# Patient Record
Sex: Male | Born: 1950 | Race: White | Hispanic: No | Marital: Married | Smoking: Never smoker
Health system: Southern US, Community
[De-identification: ages and names within clinical notes are randomized; demographics above are authoritative.]

## PROBLEM LIST (undated history)

## (undated) ENCOUNTER — Ambulatory Visit: Payer: PRIVATE HEALTH INSURANCE

## (undated) DIAGNOSIS — K219 Gastro-esophageal reflux disease without esophagitis: Secondary | ICD-10-CM

## (undated) DIAGNOSIS — M1990 Unspecified osteoarthritis, unspecified site: Secondary | ICD-10-CM

## (undated) DIAGNOSIS — R234 Changes in skin texture: Secondary | ICD-10-CM

## (undated) DIAGNOSIS — E785 Hyperlipidemia, unspecified: Secondary | ICD-10-CM

## (undated) DIAGNOSIS — I4891 Unspecified atrial fibrillation: Secondary | ICD-10-CM

## (undated) DIAGNOSIS — I428 Other cardiomyopathies: Secondary | ICD-10-CM

## (undated) DIAGNOSIS — K429 Umbilical hernia without obstruction or gangrene: Secondary | ICD-10-CM

## (undated) DIAGNOSIS — C801 Malignant (primary) neoplasm, unspecified: Secondary | ICD-10-CM

## (undated) HISTORY — DX: Malignant (primary) neoplasm, unspecified: C80.1

## (undated) HISTORY — PX: HERNIA REPAIR: SHX51

---

## 1997-06-02 ENCOUNTER — Ambulatory Visit (HOSPITAL_COMMUNITY): Admission: RE | Admit: 1997-06-02 | Discharge: 1997-06-02 | Payer: Self-pay | Admitting: Podiatry

## 1997-12-11 ENCOUNTER — Emergency Department (HOSPITAL_COMMUNITY): Admission: EM | Admit: 1997-12-11 | Discharge: 1997-12-11 | Payer: Self-pay | Admitting: Emergency Medicine

## 2001-09-18 ENCOUNTER — Ambulatory Visit (HOSPITAL_COMMUNITY): Admission: RE | Admit: 2001-09-18 | Discharge: 2001-09-18 | Payer: Self-pay | Admitting: Pediatric Nephrology

## 2006-10-18 ENCOUNTER — Encounter: Admission: RE | Admit: 2006-10-18 | Discharge: 2006-10-18 | Payer: Self-pay | Admitting: Family Medicine

## 2007-06-27 ENCOUNTER — Encounter: Admission: RE | Admit: 2007-06-27 | Discharge: 2007-06-27 | Payer: Self-pay | Admitting: Family Medicine

## 2008-01-12 ENCOUNTER — Encounter: Admission: RE | Admit: 2008-01-12 | Discharge: 2008-01-12 | Payer: Self-pay | Admitting: Family Medicine

## 2008-06-24 ENCOUNTER — Encounter: Admission: RE | Admit: 2008-06-24 | Discharge: 2008-06-24 | Payer: Self-pay | Admitting: Family Medicine

## 2010-06-02 NOTE — Op Note (Signed)
   [name] [name], [name] [name]                        ACCOUNT NO.:  [id number]   MEDICAL RECORD NO.:  [id number]                   PATIENT TYPE:  AMB   LOCATION:  ENDO                                 FACILITY:  [facility]   PHYSICIAN:  Petra Kuba, M.D.                 DATE OF BIRTH:  06/10/1950   DATE OF PROCEDURE:  09/18/2001  DATE OF DISCHARGE:                                 OPERATIVE REPORT   PROCEDURE:  Esophagogastroduodenoscopy.   ENDOSCOPIST:  Petra Kuba, M.D.   INDICATIONS:  Patient with longstanding upper tract symptoms.  Want to rule  out Barrett's or other etiologies.   INFORMED CONSENT:  Consent was signed after risks, benefits, methods and  options were thoroughly discussed in the office.  [name]   MEDICATIONS USED:  Additional medicines for this procedure - Demerol 20 mg  and Versed 2 mg.   DESCRIPTION OF PROCEDURE:  The video endoscope was inserted by direct  vision.  The proximal and mid esophagus were normal.  In the distal  esophagus was a moderate size hiatal hernia.  No signs of Barrett's  esophagus or significant esophagitis was seen.  The scope was passed into  the stomach, advanced through a normal antrum, normal pylorus and into a  normal duodenal bulb, around the C-loop to a normal second portion of the  duodenum.  The scope was drawn back to the bulb and good look there ruled  out ulcerative complications.  The scope was withdrawn back to the stomach  and retroflexed.  High in the cardia, the hiatal hernia was confirmed.  The  angularis, lesser and greater curve and fundus were all normal.  The  retroflexion with straight visualization of the stomach did reveal some mild  gastritis but without any other lesions.  Air was suctioned, and the scope  was slowly withdrawn.  Again, on slow withdrawal, a good look at the hiatal  hernia pouch and the esophagus were normal.  The scope was removed.  The  patient tolerated the procedure well.  There was no obvious  immediate  complication.   ENDOSCOPIC DIAGNOSES:  1. Small to medium size hiatal hernia.  2. Minimal gastritis.  3. Otherwise normal esophagogastroduodenoscopy.   PLAN:  1. Continue pump inhibitors.  2. Follow up p.r.n. or in two months to recheck symptoms and make sure no     further workup plans are needed.                                               Petra Kuba, M.D.    MEM/MEDQ  D:  09/18/2001  T:  09/19/2001  Job:  [id number]   cc:   Desma Maxim, M.D.

## 2010-06-02 NOTE — Op Note (Signed)
[name] [name], [name] [name]                        ACCOUNT NO.:  [id number]   MEDICAL RECORD NO.:  [id number]                   PATIENT TYPE:  AMB   LOCATION:  ENDO                                 FACILITY:  [facility]   PHYSICIAN:  Petra Kuba, M.D.                 DATE OF BIRTH:  Mar 07, 1950   DATE OF PROCEDURE:  09/18/2001  DATE OF DISCHARGE:                                 OPERATIVE REPORT   PROCEDURE:  Colonoscopy.   ENDOSCOPIST:  Petra Kuba, M.D.   INDICATIONS FOR PROCEDURE:  History of colon polyps; due for repeat  screening.   INFORMED CONSENT:  Consent was signed after risks, benefits, methods and  options were thoroughly discussed in the office.   MEDICATIONS USED:  Demerol 80 mg, Versed 8 mg.   DESCRIPTION OF PROCEDURE:  Rectal inspection was pertinent for external  hemorrhoids.  Digital exam was negative.   The video pediatric adjustable colonoscope was inserted and fairly easily  advanced around the colon to the cecum.  This did require some abdominal  pressure but no position changes.  No obvious abnormality was seen on  insertion.  The cecum was identified by the appendiceal orifice and the  ileocecal valve.  In fact, the scope was inserted a short way into the  terminal ileum which was normal.  Photodocumentation was obtained.  The  scope was slowly withdrawn.  On slow withdrawal through the colon, no  polypoid lesions, masses or other abnormalities were seen as we slowly  withdrew back to the rectum.  In the rectum, however, there was some very  minimal proctitis that look probably more prepped induced than a real  significant proctitis.  The scope was retroflexed showing some tiny internal  hemorrhoids.  The  scope was straightened and readvanced up a short ways up  the sigmoid.  Air was suctioned and scope removed.  The patient tolerated  the procedure well.  There was no obvious immediate complication.   ENDOSCOPIC DIAGNOSES:  1. Small internal and external  hemorrhoids.  2. Minimal proctitis, probably prep induced.  3. Otherwise within normal limits to the terminal ileum.    PLAN:  1. Probably recheck colon screening in five years.  Since I do not have his     dictation from his previous colonoscopy, I will get that just to confirm     it is okay to wait until then.  2. Continue workup and plans with an EGD for his longstanding upper tract     symptoms.                                               Petra Kuba, M.D.    MEM/MEDQ  D:  09/18/2001  T:  09/19/2001  Job:  [id number]  cc:   Desma Maxim, M.D.

## 2011-06-26 ENCOUNTER — Emergency Department (INDEPENDENT_AMBULATORY_CARE_PROVIDER_SITE_OTHER)
Admission: EM | Admit: 2011-06-26 | Discharge: 2011-06-26 | Disposition: A | Discharge status: Home / Self Care | Payer: BC Managed Care – PPO | Source: Home / Self Care

## 2011-06-26 ENCOUNTER — Encounter (HOSPITAL_COMMUNITY): Payer: Self-pay | Admitting: *Deleted

## 2011-06-26 DIAGNOSIS — J340 Abscess, furuncle and carbuncle of nose: Secondary | ICD-10-CM

## 2011-06-26 DIAGNOSIS — J3489 Other specified disorders of nose and nasal sinuses: Secondary | ICD-10-CM

## 2011-06-26 HISTORY — DX: Hyperlipidemia, unspecified: E78.5

## 2011-06-26 HISTORY — DX: Gastro-esophageal reflux disease without esophagitis: K21.9

## 2011-06-26 MED ORDER — MUPIROCIN CALCIUM 2 % EX CREA: TOPICAL_CREAM | Freq: Three times a day (TID) | CUTANEOUS | Status: AC

## 2011-06-26 MED ORDER — DOXYCYCLINE HYCLATE 100 MG PO TABS: 100.0000 mg | ORAL_TABLET | Freq: Two times a day (BID) | ORAL | Status: AC

## 2011-06-26 NOTE — ED Provider Notes (Signed)
[name] [name] is a 61 y.o. male who presents to Urgent Care today for and swelling for 3 days. Patient initially noted mild tenderness and discharge.  Over the last few days the swelling and pain and tenderness have worsened.  He notes some pain in the face and teeth.  He denies any pain with eye motion, however he notes a mild headache.    Also of note he had a tick bite one month ago and aside from the above complaints is asymptomatic.   PMH reviewed. Significant for hyperlipidemia History  Substance Use Topics  . Smoking status: Not on file  . Smokeless tobacco: Not on file  . Alcohol Use: Not on file   ROS as above Medications reviewed. No current facility-administered medications for this encounter.   Current Outpatient Prescriptions  Medication Sig Dispense Refill  . doxycycline (VIBRA-TABS) 100 MG tablet Take 1 tablet (100 mg total) by mouth 2 (two) times daily.  14 tablet  0  . mupirocin cream (BACTROBAN) 2 % Apply topically 3 (three) times daily.  15 g  0    Exam:  BP 134/75  Pulse 68  Temp(Src) 98.5 F (36.9 C) (Oral)  Resp 18  SpO2 96% Gen: Well NAD HEENT: EOMI,  MMM, the tip of the nose is erythematous and tender. The anterior nares have erythema and tenderness with some pus collected on the nasal hair.  No facial tenderness to palpation area no oral lesions.  Nasal turbinates are normal appearing proximally.   Skin: No rashes present  No results found for this or any previous visit (from the past 24 hour(s)). No results found.  Assessment and Plan: 61 y.o. male with nasal cellulitis.  Plan to treat with oral doxycycline and topical mupirocin.  Discussed warning signs or symptoms including for preseptal and post-septal cellulitis.  Recommended followup with primary care doctor if not improving.  Also warned about sun exposure with doxycycline.  Please see discharge instructions. Patient expresses understanding.     Rodolph Bong, MD 06/26/11 [phone]

## 2011-06-26 NOTE — ED Notes (Signed)
3 days of redness and swelling of the nose

## 2011-06-26 NOTE — Discharge Instructions (Signed)
Thank you for coming in today. Take the oral antibiotic twice a day for one week Apply antibiotic cream 3 times a day for a week. Wear plenty of sunscreen. If your pain worsens or you have fevers or chills or pain when you move her eyes go to the emergency room. Followup with your primary care doctor if you do not improve.

## 2011-06-26 NOTE — ED Provider Notes (Signed)
Medical screening examination/treatment/procedure(s) were performed by a resident physician and as supervising physician I was immediately available for consultation/collaboration.  Leslee Home, M.D.   Reuben Likes, MD 06/26/11 2119

## 2011-08-10 ENCOUNTER — Other Ambulatory Visit: Payer: Self-pay | Admitting: Family Medicine

## 2011-08-10 DIAGNOSIS — R413 Other amnesia: Secondary | ICD-10-CM

## 2011-08-17 ENCOUNTER — Ambulatory Visit
Admission: RE | Admit: 2011-08-17 | Discharge: 2011-08-17 | Disposition: A | Discharge status: Home / Self Care | Payer: BC Managed Care – PPO | Source: Ambulatory Visit | Attending: Family Medicine | Admitting: Family Medicine

## 2011-08-17 DIAGNOSIS — R413 Other amnesia: Secondary | ICD-10-CM

## 2011-08-17 MED ORDER — GADOBENATE DIMEGLUMINE 529 MG/ML IV SOLN
20.0000 mL | Freq: Once | INTRAVENOUS | Status: AC | PRN
  Administered 2011-08-17: 20 mL via INTRAVENOUS

## 2011-09-14 ENCOUNTER — Ambulatory Visit
Admission: RE | Admit: 2011-09-14 | Discharge: 2011-09-14 | Disposition: A | Discharge status: Home / Self Care | Payer: BC Managed Care – PPO | Source: Ambulatory Visit | Attending: Family Medicine | Admitting: Family Medicine

## 2011-09-14 ENCOUNTER — Other Ambulatory Visit: Payer: Self-pay | Admitting: Family Medicine

## 2011-09-14 DIAGNOSIS — R1031 Right lower quadrant pain: Secondary | ICD-10-CM

## 2011-09-14 MED ORDER — IOHEXOL 300 MG/ML  SOLN
100.0000 mL | Freq: Once | INTRAMUSCULAR | Status: AC | PRN
  Administered 2011-09-14: 100 mL via INTRAVENOUS

## 2012-07-15 ENCOUNTER — Other Ambulatory Visit: Payer: Self-pay | Admitting: Otolaryngology

## 2012-07-15 DIAGNOSIS — H905 Unspecified sensorineural hearing loss: Secondary | ICD-10-CM

## 2013-01-15 DIAGNOSIS — C801 Malignant (primary) neoplasm, unspecified: Secondary | ICD-10-CM

## 2013-01-15 HISTORY — DX: Malignant (primary) neoplasm, unspecified: C80.1

## 2013-09-09 ENCOUNTER — Other Ambulatory Visit: Payer: Self-pay | Admitting: Family Medicine

## 2013-09-09 DIAGNOSIS — R109 Unspecified abdominal pain: Secondary | ICD-10-CM

## 2013-09-16 ENCOUNTER — Other Ambulatory Visit: Payer: BC Managed Care – PPO

## 2013-09-17 ENCOUNTER — Ambulatory Visit
Admission: RE | Admit: 2013-09-17 | Discharge: 2013-09-17 | Disposition: A | Discharge status: Home / Self Care | Payer: BC Managed Care – PPO | Source: Ambulatory Visit | Attending: Family Medicine | Admitting: Family Medicine

## 2013-09-17 DIAGNOSIS — R109 Unspecified abdominal pain: Secondary | ICD-10-CM

## 2013-09-17 MED ORDER — IOHEXOL 300 MG/ML  SOLN
125.0000 mL | Freq: Once | INTRAMUSCULAR | Status: AC | PRN
  Administered 2013-09-17: 125 mL via INTRAVENOUS

## 2014-11-25 ENCOUNTER — Ambulatory Visit
Admission: RE | Admit: 2014-11-25 | Discharge: 2014-11-25 | Disposition: A | Discharge status: Home / Self Care | Payer: BLUE CROSS/BLUE SHIELD | Source: Ambulatory Visit | Attending: Family Medicine | Admitting: Family Medicine

## 2014-11-25 ENCOUNTER — Other Ambulatory Visit: Payer: Self-pay | Admitting: Family Medicine

## 2014-11-25 DIAGNOSIS — R079 Chest pain, unspecified: Secondary | ICD-10-CM

## 2015-12-22 ENCOUNTER — Other Ambulatory Visit: Payer: Self-pay | Admitting: Family Medicine

## 2015-12-22 ENCOUNTER — Ambulatory Visit
Admission: RE | Admit: 2015-12-22 | Discharge: 2015-12-22 | Disposition: A | Discharge status: Home / Self Care | Payer: BLUE CROSS/BLUE SHIELD | Source: Ambulatory Visit | Attending: Family Medicine | Admitting: Family Medicine

## 2015-12-22 DIAGNOSIS — M25562 Pain in left knee: Secondary | ICD-10-CM

## 2015-12-22 DIAGNOSIS — M25552 Pain in left hip: Secondary | ICD-10-CM

## 2016-01-16 DIAGNOSIS — Z8679 Personal history of other diseases of the circulatory system: Secondary | ICD-10-CM

## 2016-01-16 HISTORY — DX: Personal history of other diseases of the circulatory system: Z86.79

## 2016-05-30 ENCOUNTER — Other Ambulatory Visit: Payer: Self-pay | Admitting: Family Medicine

## 2016-05-30 ENCOUNTER — Ambulatory Visit
Admission: RE | Admit: 2016-05-30 | Discharge: 2016-05-30 | Disposition: A | Discharge status: Home / Self Care | Payer: BLUE CROSS/BLUE SHIELD | Source: Ambulatory Visit | Attending: Family Medicine | Admitting: Family Medicine

## 2016-05-30 DIAGNOSIS — I4891 Unspecified atrial fibrillation: Secondary | ICD-10-CM

## 2016-05-30 DIAGNOSIS — R0609 Other forms of dyspnea: Secondary | ICD-10-CM

## 2016-05-30 DIAGNOSIS — R0600 Dyspnea, unspecified: Secondary | ICD-10-CM

## 2016-05-31 ENCOUNTER — Telehealth: Payer: Self-pay | Admitting: Cardiovascular Disease

## 2016-05-31 NOTE — Telephone Encounter (Signed)
Received records from [facility] for appointment on 06/07/16 with Dr Croitoru.  Records put with Dr Croitoru's schedule for 06/07/16. lp  °

## 2016-06-07 ENCOUNTER — Ambulatory Visit (INDEPENDENT_AMBULATORY_CARE_PROVIDER_SITE_OTHER): Payer: BLUE CROSS/BLUE SHIELD | Admitting: Cardiovascular Disease

## 2016-06-07 ENCOUNTER — Encounter: Payer: Self-pay | Admitting: Cardiovascular Disease

## 2016-06-07 VITALS — BP 110/84 | HR 97 | Ht 68.0 in | Wt 254.0 lb

## 2016-06-07 DIAGNOSIS — R739 Hyperglycemia, unspecified: Secondary | ICD-10-CM

## 2016-06-07 DIAGNOSIS — I4891 Unspecified atrial fibrillation: Secondary | ICD-10-CM

## 2016-06-07 DIAGNOSIS — E668 Other obesity: Secondary | ICD-10-CM | POA: Diagnosis not present

## 2016-06-07 DIAGNOSIS — I428 Other cardiomyopathies: Secondary | ICD-10-CM

## 2016-06-07 DIAGNOSIS — E7800 Pure hypercholesterolemia, unspecified: Secondary | ICD-10-CM | POA: Diagnosis not present

## 2016-06-07 MED ORDER — RIVAROXABAN 20 MG PO TABS: 20.0000 mg | ORAL_TABLET | Freq: Every day | ORAL | 11 refills | Status: DC

## 2016-06-07 NOTE — Patient Instructions (Signed)
Medication Instructions: Dr Sallyanne Kuster has recommended making the following medication changes: 1. START Xarelto 20 mg - take 1 tablet by mouth daily  Labwork: NONE ORDERED  Testing/Procedures: 1. Echocardiogram  - Your physician has requested that you have an echocardiogram. Echocardiography is a painless test that uses sound waves to create images of your heart. It provides your doctor with information about the size and shape of your heart and how well your heart's chambers and valves are working. This procedure takes approximately one hour. There are no restrictions for this procedure.  2. Exercise Tolerance Test - Your physician has requested that you have an exercise tolerance test. For further information please visit HugeFiesta.tn. Please also follow instruction sheet, as given.  These tests have been ordered to be performed at our [facility] location - [address].  Follow-up: Dr Sallyanne Kuster recommends that you schedule a follow-up appointment after tests are completed.  If you need a refill on your cardiac medications before your next appointment, please call your pharmacy.

## 2016-06-08 ENCOUNTER — Encounter: Payer: Self-pay | Admitting: Cardiovascular Disease

## 2016-06-08 DIAGNOSIS — I428 Other cardiomyopathies: Secondary | ICD-10-CM | POA: Insufficient documentation

## 2016-06-08 DIAGNOSIS — R739 Hyperglycemia, unspecified: Secondary | ICD-10-CM | POA: Insufficient documentation

## 2016-06-08 DIAGNOSIS — E668 Other obesity: Secondary | ICD-10-CM | POA: Insufficient documentation

## 2016-06-08 DIAGNOSIS — I4891 Unspecified atrial fibrillation: Secondary | ICD-10-CM | POA: Insufficient documentation

## 2016-06-08 DIAGNOSIS — E7800 Pure hypercholesterolemia, unspecified: Secondary | ICD-10-CM | POA: Insufficient documentation

## 2016-06-08 NOTE — Progress Notes (Signed)
Cardiology consultation Note:    Date:  06/08/2016   ID:  ZALAN SHIDLER, DOB [birth year], MRN [id number]  PCP:  Mayra Neer, MD  Cardiologist:  Sanda Klein, MD    Referring MD: Mayra Neer, MD  Chief Complaint  Patient presents with  . Follow-up    New patient.  . Atrial Fibrillation  . Shortness of Breath  . Chest Pain  . Headache  . Edema   GEREMY [name] is a 66 y.o. male who is being seen today for the evaluation of newly diagnosed atrial fibrillation at the request of K. Brigitte Pulse, MD   History of Present Illness:    [name] [name] is a 66 y.o. male with a hx of Hyperlipidemia gastroesophageal reflux disease and remote melanoma resection, presenting for recently diagnosed atrial fibrillation. Reports noticing some mild irregularity in his heartbeat for many years, but when he was seen in the office on May 16 was found to be in atrial fibrillation was spontaneously controlled ventricular response. Previous physical exam and ECG most recently performed in January documented normal sinus rhythm.  He does not have a history of stroke or other focal neurological deficits. He has not had serious bleeding problems and has not had problems with falls or injuries.[name] [name] He is almost 66 years old. His most recent fasting glucose was borderline diagnostic of diabetes mellitus 126. But as recently as January his glucose was 94. He does not have a history of coronary disease are other vascular problems. He has tried numerous statins for hyperlipidemia (including simvastatin, pravastatin, rosuvastatin) and the only one he has tolerated is Livalo.  His records include an echocardiogram from 2008 which was performed for cardiomegaly. At that point his left ventricle was at upper limits of normal in size with an end-diastolic diameter 55 mm. Ejection fraction was normal estimated at 65% but the left atrium was mildly dilated at 41 mm. No serious valvular abnormalities were found. No comment was  made about diastolic function. He had a nuclear stress test also in 2008 which was a normal study.  The patient specifically denies any chest pain at rest or exertion, dyspnea at rest or with exertion, orthopnea, paroxysmal nocturnal dyspnea, syncope, rare palpitations, focal neurological deficits, intermittent claudication, unexplained weight gain, cough, hemoptysis or wheezing. No GI bleeding or other bleeding problems. No falls. He snores, but does not have daytime hypersomnolence. He only scores 5 points on the Epworth scale. Over the last 2 or 3 months he has had problems with swelling in his legs that usually resolves after he lies down overnight.   Past Medical History:  Diagnosis Date  . Cancer ([place])   . GERD (gastroesophageal reflux disease)   . Hyperlipidemia     No past surgical history on file.  Current Medications: Current Meds  Medication Sig  . aspirin 81 MG chewable tablet Chew 81 mg by mouth daily.  [name] [name] atorvastatin (LIPITOR) 10 MG tablet Take 10 mg by mouth daily.  . BUPROPION HCL PO Take 150 mg by mouth daily.  . naproxen sodium (ANAPROX) 220 MG tablet Take 440 mg by mouth every morning.  [name] [name] omeprazole (PRILOSEC) 20 MG capsule Take 20 mg by mouth daily.  . [DISCONTINUED] IBUPROFEN PO Take by mouth daily as needed.     Allergies:   Patient has no known allergies.   Social History   Social History  . Marital status: Married    Spouse name: N/A  . Number of children: N/A  . Years of  education: N/A   Social History Main Topics  . Smoking status: Never Smoker  . Smokeless tobacco: Not on file  . Alcohol use Yes     Comment: occasionally  . Drug use: No  . Sexual activity: Not on file   Other Topics Concern  . Not on file   Social History Narrative  . No narrative on file     Family History: The patient's family history includes Alzheimer's disease in his sister; Heart Problems in his brother, brother, and father; Stroke in his brother. ROS:   Please see  the history of present illness.     All other systems reviewed and are negative.  EKGs/Labs/Other Studies Reviewed:    The following studies were reviewed today: Echo and Nuclear study from 2008, ECG from Dr. Brigitte Pulse from January and May 2018  EKG:  EKG is  ordered today.  The ekg ordered today demonstrates atrial fibrillation with ventricular rate 97 bpm at rest. There is poor R-wave progression with transition in V5 and left axis deviation, not meeting criteria for left anterior fascicular block. Normal QTC 447 ms. No clear repolarization abnormalities are seen.  Recent Labs: 05/30/2016 hemoglobin 15.4, glucose 126, BUN 18, creatinine 1.1, potassium 3.8, normal liver function tests, TSH 2.6 Hemoglobin A1c 5.8%  Total cholesterol 190, triglycerides 132, HDL 46, LDL 117  Physical Exam:    VS:  BP 110/84   Pulse 97   Ht 5\' 8"  (1.727 m)   Wt 254 lb (115.2 kg)   BMI 38.62 kg/m     Wt Readings from Last 3 Encounters:  06/07/16 254 lb (115.2 kg)     GEN:  Well nourished, well developed in no acute distress HEENT: Normal NECK: No JVD; No carotid bruits LYMPHATICS: No lymphadenopathy CARDIAC: irregular, no murmurs, rubs, gallops RESPIRATORY:  Clear to auscultation without rales, wheezing or rhonchi  ABDOMEN: Soft, non-tender, non-distended MUSCULOSKELETAL:  No edema; No deformity  SKIN: Warm and dry NEUROLOGIC:  Alert and oriented x 3 PSYCHIATRIC:  Normal affect   ASSESSMENT:    1. Atrial fibrillation, unspecified type ([place])   2. Nonischemic cardiomyopathy ([place])   3. Hyperglycemia   4. Hypercholesterolemia   5. Moderate obesity    PLAN:    In order of problems listed above:  1. AFib; rate control has occur spontaneously, was suggesting may have some degree of AV conduction abnormality. We'll need to make sure that his rate is indeed controlled throughout the day. May need a 24-hour monitor. At this point I'm not sure whether he has paroxysmal atrial fibrillation (just  coincidently identified on 2 separate office visits) or whether he is in a spell of persistent atrial fibrillation. Will get an opportunity to see how persistent rhythm is when he comes back for echo and stress testing. The stress test might also give [place] an idea about how his heart rate response to activity and whether or not he needs rate control medications. Also unclear is whether or not he needs to be on long-term anticoagulation. He has several borderline risk factors: Age 43, hyperglycemia without overt diabetes mellitus, history of borderline left ventricular enlargement without heart failure, dilated left atrium by echo in 2008. Depending on how one categorizes these borderline risk factors his CHADSVasc score could be as low as 0 or as high as 3. I believe that eventually he will require anticoagulation as he gets older and I believe his bleeding risk is low. As such, I recommended that he starts anticoagulation with a  direct oral anticoagulant. He prefers a once daily agents so we'll use Xarelto. Discussed the purpose of this medication and its risks and benefits. Instructed him to report bleeding complications promptly. He's not particularly symptomatic from the arrhythmia. Nevertheless it might be worthwhile to consider cardioversion, since this is an initial presentation. First we have to establish whether or not this is truly a persistent arrhythmia or whether it has been starting and stopping by itself. If this is persistent atrial fibrillation, there is no rush to proceed to cardioversion. Would only do so after at least 3 weeks of compliance with oral anticoagulant. 2. CMP: Abnormalities detected by echo in 2008 suggest that he may have a slowly progressive cardiomyopathy. He has developed problems with leg edema. Repeat echocardiogram. 3. Hyperglycemia: Both his glucose and hemoglobin A1c are abnormal, but he does not fully meet criteria for diabetes at this point. Encouraged him to lose  weight, avoid simple carbohydrates and starches with high glycemic index, exercise regularly. 4. HLP: He has been intolerant of other statins but seems to be doing okay on current dose of Livalo. Ideally his LDL would be under 100, but he may not tolerate higher doses of statin. 5. Moderate obesity: Discussed the relationship between the burden of atrial fibrillation and weight. Weight loss would be beneficial for many points of view   Medication Adjustments/Labs and Tests Ordered: Current medicines are reviewed at length with the patient today.  Concerns regarding medicines are outlined above. Labs and tests ordered and medication changes are outlined in the patient instructions below:  Patient Instructions  Medication Instructions: Dr Sallyanne Kuster has recommended making the following medication changes: 1. START Xarelto 20 mg - take 1 tablet by mouth daily  Labwork: NONE ORDERED  Testing/Procedures: 1. Echocardiogram  - Your physician has requested that you have an echocardiogram. Echocardiography is a painless test that uses sound waves to create images of your heart. It provides your doctor with information about the size and shape of your heart and how well your heart's chambers and valves are working. This procedure takes approximately one hour. There are no restrictions for this procedure.  2. Exercise Tolerance Test - Your physician has requested that you have an exercise tolerance test. For further information please visit HugeFiesta.tn. Please also follow instruction sheet, as given.  These tests have been ordered to be performed at our [facility] location - [address].  Follow-up: Dr Sallyanne Kuster recommends that you schedule a follow-up appointment after tests are completed.  If you need a refill on your cardiac medications before your next appointment, please call your pharmacy.    Signed, Sanda Klein, MD  06/08/2016 12:34 PM    [place]

## 2016-06-19 ENCOUNTER — Telehealth: Payer: Self-pay | Admitting: *Deleted

## 2016-06-19 NOTE — Telephone Encounter (Signed)
PA #[id number] sent through cover my meds and approved.

## 2016-06-21 ENCOUNTER — Ambulatory Visit: Payer: BLUE CROSS/BLUE SHIELD | Admitting: Cardiovascular Disease

## 2016-06-26 ENCOUNTER — Ambulatory Visit (HOSPITAL_COMMUNITY): Payer: BLUE CROSS/BLUE SHIELD | Attending: Cardiology

## 2016-06-26 ENCOUNTER — Ambulatory Visit (INDEPENDENT_AMBULATORY_CARE_PROVIDER_SITE_OTHER): Payer: BLUE CROSS/BLUE SHIELD

## 2016-06-26 ENCOUNTER — Other Ambulatory Visit: Payer: Self-pay

## 2016-06-26 DIAGNOSIS — I4891 Unspecified atrial fibrillation: Secondary | ICD-10-CM

## 2016-06-26 DIAGNOSIS — I358 Other nonrheumatic aortic valve disorders: Secondary | ICD-10-CM | POA: Insufficient documentation

## 2016-06-26 DIAGNOSIS — E785 Hyperlipidemia, unspecified: Secondary | ICD-10-CM | POA: Diagnosis not present

## 2016-06-26 LAB — EXERCISE TOLERANCE TEST
CHL CUP RESTING HR STRESS: 112 {beats}/min
CHL RATE OF PERCEIVED EXERTION: 16
CSEPEW: 4.6 METS
Exercise duration (min): 3 min
Exercise duration (sec): 0 s
MPHR: 156 {beats}/min
Peak HR: 173 {beats}/min
Percent HR: 110 %

## 2016-06-26 MED ORDER — PERFLUTREN LIPID MICROSPHERE
1.0000 mL | INTRAVENOUS | Status: AC | PRN
  Administered 2016-06-26: 2 mL via INTRAVENOUS

## 2016-07-03 ENCOUNTER — Ambulatory Visit (INDEPENDENT_AMBULATORY_CARE_PROVIDER_SITE_OTHER): Payer: BLUE CROSS/BLUE SHIELD | Admitting: Cardiology

## 2016-07-03 ENCOUNTER — Encounter: Payer: Self-pay | Admitting: Cardiology

## 2016-07-03 VITALS — BP 108/80 | HR 86 | Ht 68.0 in | Wt 249.0 lb

## 2016-07-03 DIAGNOSIS — I4891 Unspecified atrial fibrillation: Secondary | ICD-10-CM | POA: Diagnosis not present

## 2016-07-03 DIAGNOSIS — I428 Other cardiomyopathies: Secondary | ICD-10-CM | POA: Diagnosis not present

## 2016-07-03 DIAGNOSIS — D689 Coagulation defect, unspecified: Secondary | ICD-10-CM

## 2016-07-03 DIAGNOSIS — Z8249 Family history of ischemic heart disease and other diseases of the circulatory system: Secondary | ICD-10-CM | POA: Insufficient documentation

## 2016-07-03 DIAGNOSIS — Z01812 Encounter for preprocedural laboratory examination: Secondary | ICD-10-CM

## 2016-07-03 NOTE — Assessment & Plan Note (Signed)
EF 40-45% with diffuse HK in AF

## 2016-07-03 NOTE — Assessment & Plan Note (Signed)
Intolerant to all statins except Livalo

## 2016-07-03 NOTE — Progress Notes (Signed)
07/03/2016 [name] [name]   [birth year]  [id number]  Primary Physician Mayra Neer, MD Primary Cardiologist: Dr Sallyanne Kuster  HPI:  66 y/o male referred to Dr Sallyanne Kuster 06/07/16 for an incidental finding of AF. The pt was relatively asymptomatic and the duration was unknown, although the pt says he felt some decline in exercise tolerance since last summer. Dr Sallyanne Kuster started Xarelto and obtained an echo and GXT. The pt is seen today in follow up. He remains asymptomatic. His echo showed diffuse HK with an EF of 40-45% and normal LA size. His treadmill showed no evidence of ischemia but his HR went to 170 quickly. The pt has a strong family history of CAD. He denies any exertional chest tightness or pressure and says he is very active.    Current Outpatient Prescriptions  Medication Sig Dispense Refill  . BUPROPION HCL PO Take 150 mg by mouth daily.    [name] omeprazole (PRILOSEC) 20 MG capsule Take 20 mg by mouth daily.    . Pitavastatin Calcium (LIVALO) 2 MG TABS Take 1 tablet by mouth daily.    . rivaroxaban (XARELTO) 20 MG TABS tablet Take 1 tablet (20 mg total) by mouth daily with supper. 30 tablet 11   No current facility-administered medications for this visit.     No Known Allergies  Past Medical History:  Diagnosis Date  . Cancer ([place])   . GERD (gastroesophageal reflux disease)   . Hyperlipidemia     Social History   Social History  . Marital status: Married    Spouse name: N/A  . Number of children: N/A  . Years of education: N/A   Occupational History  . Not on file.   Social History Main Topics  . Smoking status: Never Smoker  . Smokeless tobacco: Never Used  . Alcohol use Yes     Comment: occasionally  . Drug use: No  . Sexual activity: Not on file   Other Topics Concern  . Not on file   Social History Narrative  . No narrative on file     Family History  Problem Relation Age of Onset  . Heart Problems Father   . Alzheimer's disease Sister   .  Stroke Brother   . Heart Problems Brother   . Heart Problems Brother      Review of Systems: General: negative for chills, fever, night sweats or weight changes.  Cardiovascular: negative for chest pain, dyspnea on exertion, edema, orthopnea, palpitations, paroxysmal nocturnal dyspnea or shortness of breath Dermatological: negative for rash Respiratory: negative for cough or wheezing Urologic: negative for hematuria Abdominal: negative for nausea, vomiting, diarrhea, bright red blood per rectum, melena, or hematemesis Neurologic: negative for visual changes, syncope, or dizziness All other systems reviewed and are otherwise negative except as noted above.    Blood pressure 108/80, pulse 86, height 5\' 8"  (1.727 m), weight 249 lb (112.9 kg).  General appearance: alert, cooperative, no distress and moderately obese Neck: no carotid bruit and no JVD Lungs: clear to auscultation bilaterally Heart: irregularly irregular rhythm Abdomen: soft, non-tender; bowel sounds normal; no masses,  no organomegaly and ventral hernia noted Extremities: extremities normal, atraumatic, no cyanosis or edema Pulses: 2+ and symmetric Skin: Skin color, texture, turgor normal. No rashes or lesions Neurologic: Grossly normal   ASSESSMENT AND PLAN:   Atrial fibrillation (HCC) Rate controlled on no AV nodal blocking agents. Unknown duration, possibly a year. Pt is minimally symptomatic.  Nonischemic cardiomyopathy (HCC) EF 40-45% with diffuse HK in  AF  Moderate obesity BMI 37- no history of sleep apnea  Hypercholesterolemia Intolerant to all statins except Livalo  Family history of coronary artery disease in brother Two brothers died in their 82's of MI   PLAN  Discussed with Dr Gwenlyn Found- the pt's family history is concerning for CAD though the pt has no symptoms. We will go ahead with OP DCCV and check a f/u echo a month or so later. If his EF is not normal we may need to consider further ischemic  evaluation at that point.   Kerin Ransom PA-C 07/03/2016 9:19 AM

## 2016-07-03 NOTE — Assessment & Plan Note (Signed)
BMI 37- no history of sleep apnea

## 2016-07-03 NOTE — Patient Instructions (Addendum)
Medication Instructions:  Your physician recommends that you continue on your current medications as directed. Please refer to the Current Medication list given to you today.  Labwork: Your physician recommends that you return for lab work in: 14 days before Cardioversion. Complete: BMP, CBC, PTT, INR  Testing/Procedures: CARDIOVERSION  DO NOT MISS DOSES OF XARELTO. If you do contact office Immediately.   Your provider has recommended a cardioversion.   You are scheduled for a cardioversion on 07/23/17 at 9:00 am with Dr. Sallyanne Kuster or associates. Please go to [facility] [place] Stay at no later than 7:30 am.  Enter through the [place] not have any food or drink after midnight night before  You may take your medicines with a sip of water on the day of your procedure.  You will need someone to drive you home following your procedure.   Call the [place] office at [phone] if you have any questions, problems or concerns.   Follow-Up: Your physician recommends that you schedule a follow-up appointment in: 1 month after procedure with Kerin Ransom and 6 weeks with Dr Sallyanne Kuster.   Any Other Special Instructions Will Be Listed Below (If Applicable).   Electrical Cardioversion Electrical cardioversion is the delivery of a jolt of electricity to change the rhythm of the heart. Sticky patches or metal paddles are placed on the chest to deliver the electricity from a device. This is done to restore a normal rhythm. A rhythm that is too fast or not regular keeps the heart from pumping well. Electrical cardioversion is done in an emergency if:   There is low or no blood pressure as a result of the heart rhythm.   Normal rhythm must be restored as fast as possible to protect the brain and heart from further damage.   It may save a life. Cardioversion may be done for heart rhythms that are not immediately life threatening, such as  atrial fibrillation or flutter, in which:   The heart is beating too fast or is not regular.   Medicine to change the rhythm has not worked.   It is safe to wait in order to allow time for preparation.  Symptoms of the abnormal rhythm are bothersome.  The risk of stroke and other serious problems can be reduced.  LET [facility] CARE PROVIDER KNOW ABOUT:   Any allergies you have.  All medicines you are taking, including vitamins, herbs, eye drops, creams, and over-the-counter medicines.  Previous problems you or members of your family have had with the use of anesthetics.   Any blood disorders you have.   Previous surgeries you have had.   Medical conditions you have.   RISKS AND COMPLICATIONS  Generally, this is a safe procedure. However, problems can occur and include:   Breathing problems related to the anesthetic used.  A blood clot that breaks free and travels to other parts of your body. This could cause a stroke or other problems. The risk of this is lowered by use of blood-thinning medicine (anticoagulant) prior to the procedure.  Cardiac arrest (rare).   BEFORE THE PROCEDURE   You may have tests to detect blood clots in your heart and to evaluate heart function.  You may start taking anticoagulants so your blood does not clot as easily.   Medicines may be given to help stabilize your heart rate and rhythm.   PROCEDURE  You will be given medicine through an IV tube  to reduce discomfort and make you sleepy (sedative).   An electrical shock will be delivered.   AFTER THE PROCEDURE Your heart rhythm will be watched to make sure it does not change. You will need someone to drive you home.

## 2016-07-03 NOTE — Assessment & Plan Note (Signed)
Rate controlled on no AV nodal blocking agents. Unknown duration, possibly a year. Pt is minimally symptomatic.

## 2016-07-03 NOTE — Assessment & Plan Note (Signed)
Two brothers died in their 74's of MI

## 2016-07-10 ENCOUNTER — Other Ambulatory Visit: Payer: Self-pay | Admitting: Cardiology

## 2016-07-11 LAB — BASIC METABOLIC PANEL
BUN/Creatinine Ratio: 10 (ref 10–24)
BUN: 11 mg/dL (ref 8–27)
CO2: 22 mmol/L (ref 20–29)
Calcium: 9.2 mg/dL (ref 8.6–10.2)
Chloride: 99 mmol/L (ref 96–106)
Creatinine, Ser: 1.06 mg/dL (ref 0.76–1.27)
GFR calc Af Amer: 85 mL/min/{1.73_m2} (ref >59)
GFR calc non Af Amer: 74 mL/min/{1.73_m2} (ref >59)
Glucose: 85 mg/dL (ref 65–99)
Potassium: 4.8 mmol/L (ref 3.5–5.2)
Sodium: 138 mmol/L (ref 134–144)

## 2016-07-11 LAB — CBC
Hematocrit: 45.4 % (ref 37.5–51.0)
Hemoglobin: 15.9 g/dL (ref 13.0–17.7)
MCH: 29.6 pg (ref 26.6–33.0)
MCHC: 35 g/dL (ref 31.5–35.7)
MCV: 85 fL (ref 79–97)
Platelets: 172 10*3/uL (ref 150–379)
RBC: 5.37 x10E6/uL (ref 4.14–5.80)
RDW: 13.9 % (ref 12.3–15.4)
WBC: 7.6 10*3/uL (ref 3.4–10.8)

## 2016-07-11 LAB — PROTIME-INR
INR: 1.1 (ref 0.8–1.2)
Prothrombin Time: 11.9 s (ref 9.1–12.0)

## 2016-07-11 LAB — TSH: TSH: 2.08 u[IU]/mL (ref 0.450–4.500)

## 2016-07-11 LAB — APTT: aPTT: 35 s — ABNORMAL HIGH (ref 24–33)

## 2016-07-11 LAB — MAGNESIUM: Magnesium: 2.1 mg/dL (ref 1.6–2.3)

## 2016-07-23 ENCOUNTER — Ambulatory Visit (HOSPITAL_COMMUNITY)
Admission: RE | Admit: 2016-07-23 | Discharge: 2016-07-23 | Disposition: A | Discharge status: Home / Self Care | Payer: BLUE CROSS/BLUE SHIELD | Source: Ambulatory Visit | Attending: Cardiovascular Disease | Admitting: Cardiovascular Disease

## 2016-07-23 ENCOUNTER — Encounter (HOSPITAL_COMMUNITY)
Admission: RE | Disposition: A | Discharge status: Home / Self Care | Payer: Self-pay | Source: Ambulatory Visit | Attending: Cardiovascular Disease

## 2016-07-23 ENCOUNTER — Ambulatory Visit (HOSPITAL_COMMUNITY): Payer: BLUE CROSS/BLUE SHIELD | Admitting: Anesthesiology

## 2016-07-23 ENCOUNTER — Encounter (HOSPITAL_COMMUNITY): Payer: Self-pay | Admitting: *Deleted

## 2016-07-23 DIAGNOSIS — E7800 Pure hypercholesterolemia, unspecified: Secondary | ICD-10-CM | POA: Insufficient documentation

## 2016-07-23 DIAGNOSIS — E785 Hyperlipidemia, unspecified: Secondary | ICD-10-CM | POA: Insufficient documentation

## 2016-07-23 DIAGNOSIS — Z7901 Long term (current) use of anticoagulants: Secondary | ICD-10-CM | POA: Insufficient documentation

## 2016-07-23 DIAGNOSIS — E669 Obesity, unspecified: Secondary | ICD-10-CM | POA: Diagnosis not present

## 2016-07-23 DIAGNOSIS — Z8249 Family history of ischemic heart disease and other diseases of the circulatory system: Secondary | ICD-10-CM | POA: Insufficient documentation

## 2016-07-23 DIAGNOSIS — Z6837 Body mass index (BMI) 37.0-37.9, adult: Secondary | ICD-10-CM | POA: Insufficient documentation

## 2016-07-23 DIAGNOSIS — I428 Other cardiomyopathies: Secondary | ICD-10-CM | POA: Insufficient documentation

## 2016-07-23 DIAGNOSIS — Z79899 Other long term (current) drug therapy: Secondary | ICD-10-CM | POA: Insufficient documentation

## 2016-07-23 DIAGNOSIS — K219 Gastro-esophageal reflux disease without esophagitis: Secondary | ICD-10-CM | POA: Insufficient documentation

## 2016-07-23 DIAGNOSIS — I4891 Unspecified atrial fibrillation: Secondary | ICD-10-CM | POA: Insufficient documentation

## 2016-07-23 DIAGNOSIS — I481 Persistent atrial fibrillation: Secondary | ICD-10-CM | POA: Diagnosis not present

## 2016-07-23 DIAGNOSIS — I4819 Other persistent atrial fibrillation: Secondary | ICD-10-CM

## 2016-07-23 HISTORY — PX: CARDIOVERSION: SHX1299

## 2016-07-23 LAB — BASIC METABOLIC PANEL
Anion gap: 8 (ref 5–15)
BUN: 15 mg/dL (ref 6–20)
CO2: 24 mmol/L (ref 22–32)
Calcium: 9 mg/dL (ref 8.9–10.3)
Chloride: 103 mmol/L (ref 101–111)
Creatinine, Ser: 1.16 mg/dL (ref 0.61–1.24)
GFR calc Af Amer: >60 mL/min (ref >60)
GFR calc non Af Amer: >60 mL/min (ref >60)
Glucose, Bld: 107 mg/dL — ABNORMAL HIGH (ref 65–99)
Potassium: 4 mmol/L (ref 3.5–5.1)
Sodium: 135 mmol/L (ref 135–145)

## 2016-07-23 LAB — MAGNESIUM: Magnesium: 2 mg/dL (ref 1.7–2.4)

## 2016-07-23 SURGERY — CARDIOVERSION
Anesthesia: General

## 2016-07-23 MED ORDER — SODIUM CHLORIDE 0.9 % IV SOLN
250.0000 mL | INTRAVENOUS | Status: DC
  Administered 2016-07-23 (×2): via INTRAVENOUS

## 2016-07-23 MED ORDER — PROPOFOL 10 MG/ML IV BOLUS
INTRAVENOUS | Status: DC | PRN
  Administered 2016-07-23: 90 mg via INTRAVENOUS

## 2016-07-23 MED ORDER — LIDOCAINE HCL (PF) 2 % IJ SOLN
INTRAMUSCULAR | Status: DC | PRN
  Administered 2016-07-23: 40 mg via INTRADERMAL

## 2016-07-23 NOTE — Anesthesia Procedure Notes (Signed)
Procedure Name: MAC Date/Time: 07/23/2016 9:11 AM Performed by: Teressa Lower Pre-anesthesia Checklist: Patient identified, Emergency Drugs available, Suction available, Patient being monitored and Timeout performed Patient Re-evaluated:Patient Re-evaluated prior to inductionOxygen Delivery Method: Nasal cannula

## 2016-07-23 NOTE — Transfer of Care (Signed)
Immediate Anesthesia Transfer of Care Note  Patient: [name] [name]  Procedure(s) Performed: Procedure(s): CARDIOVERSION (N/A)  Patient Location: Endoscopy Unit  Anesthesia Type:General  Level of Consciousness: awake, alert  and oriented  Airway & Oxygen Therapy: Patient Spontanous Breathing and Patient connected to nasal cannula oxygen  Post-op Assessment: Report given to RN and Post -op Vital signs reviewed and stable  Post vital signs: Reviewed and stable  Last Vitals:  Vitals:   07/23/16 0738  BP: (!) 145/107  Resp: 17    Last Pain:  Vitals:   07/23/16 0738  TempSrc: Oral         Complications: No apparent anesthesia complications

## 2016-07-23 NOTE — Interval H&P Note (Signed)
History and Physical Interval Note:  07/23/2016 8:09 AM  [name] [name]  has presented today for surgery, with the diagnosis of A-FIB  The various methods of treatment have been discussed with the patient and family. After consideration of risks, benefits and other options for treatment, the patient has consented to  Procedure(s): CARDIOVERSION (N/A) as a surgical intervention .  The patient's history has been reviewed, patient examined, no change in status, stable for surgery.  I have reviewed the patient's chart and labs.  Questions were answered to the patient's satisfaction.     [name]

## 2016-07-23 NOTE — H&P (View-Only) (Signed)
07/03/2016 [name]   Jun 20, 1950  [id number]  Primary Physician Mayra Neer, MD Primary Cardiologist: Dr Sallyanne Kuster  HPI:  66 y/o male referred to Dr Sallyanne Kuster 06/07/16 for an incidental finding of AF. The pt was relatively asymptomatic and the duration was unknown, although the pt says he felt some decline in exercise tolerance since last summer. Dr Sallyanne Kuster started Xarelto and obtained an echo and GXT. The pt is seen today in follow up. He remains asymptomatic. His echo showed diffuse HK with an EF of 40-45% and normal LA size. His treadmill showed no evidence of ischemia but his HR went to 170 quickly. The pt has a strong family history of CAD. He denies any exertional chest tightness or pressure and says he is very active.    Current Outpatient Prescriptions  Medication Sig Dispense Refill  . BUPROPION HCL PO Take 150 mg by mouth daily.    [name] omeprazole (PRILOSEC) 20 MG capsule Take 20 mg by mouth daily.    . Pitavastatin Calcium (LIVALO) 2 MG TABS Take 1 tablet by mouth daily.    . rivaroxaban (XARELTO) 20 MG TABS tablet Take 1 tablet (20 mg total) by mouth daily with supper. 30 tablet 11   No current facility-administered medications for this visit.     No Known Allergies  Past Medical History:  Diagnosis Date  . Cancer ([place])   . GERD (gastroesophageal reflux disease)   . Hyperlipidemia     Social History   Social History  . Marital status: Married    Spouse name: N/A  . Number of children: N/A  . Years of education: N/A   Occupational History  . Not on file.   Social History Main Topics  . Smoking status: Never Smoker  . Smokeless tobacco: Never Used  . Alcohol use Yes     Comment: occasionally  . Drug use: No  . Sexual activity: Not on file   Other Topics Concern  . Not on file   Social History Narrative  . No narrative on file     Family History  Problem Relation Age of Onset  . Heart Problems Father   . Alzheimer's disease Sister   .  Stroke Brother   . Heart Problems Brother   . Heart Problems Brother      Review of Systems: General: negative for chills, fever, night sweats or weight changes.  Cardiovascular: negative for chest pain, dyspnea on exertion, edema, orthopnea, palpitations, paroxysmal nocturnal dyspnea or shortness of breath Dermatological: negative for rash Respiratory: negative for cough or wheezing Urologic: negative for hematuria Abdominal: negative for nausea, vomiting, diarrhea, bright red blood per rectum, melena, or hematemesis Neurologic: negative for visual changes, syncope, or dizziness All other systems reviewed and are otherwise negative except as noted above.    Blood pressure 108/80, pulse 86, height 5\' 8"  (1.727 m), weight 249 lb (112.9 kg).  General appearance: alert, cooperative, no distress and moderately obese Neck: no carotid bruit and no JVD Lungs: clear to auscultation bilaterally Heart: irregularly irregular rhythm Abdomen: soft, non-tender; bowel sounds normal; no masses,  no organomegaly and ventral hernia noted Extremities: extremities normal, atraumatic, no cyanosis or edema Pulses: 2+ and symmetric Skin: Skin color, texture, turgor normal. No rashes or lesions Neurologic: Grossly normal   ASSESSMENT AND PLAN:   Atrial fibrillation (HCC) Rate controlled on no AV nodal blocking agents. Unknown duration, possibly a year. Pt is minimally symptomatic.  Nonischemic cardiomyopathy (HCC) EF 40-45% with diffuse HK in  AF  Moderate obesity BMI 37- no history of sleep apnea  Hypercholesterolemia Intolerant to all statins except Livalo  Family history of coronary artery disease in brother Two brothers died in their 52's of MI   PLAN  Discussed with Dr Gwenlyn Found- the pt's family history is concerning for CAD though the pt has no symptoms. We will go ahead with OP DCCV and check a f/u echo a month or so later. If his EF is not normal we may need to consider further ischemic  evaluation at that point.   Kerin Ransom PA-C 07/03/2016 9:19 AM

## 2016-07-23 NOTE — Anesthesia Postprocedure Evaluation (Signed)
Anesthesia Post Note  Patient: [name] [name]  Procedure(s) Performed: Procedure(s) (LRB): CARDIOVERSION (N/A)     Patient location during evaluation: PACU Anesthesia Type: General Level of consciousness: awake Pain management: pain level controlled Vital Signs Assessment: post-procedure vital signs reviewed and stable Respiratory status: spontaneous breathing Cardiovascular status: stable Postop Assessment: no signs of nausea or vomiting Anesthetic complications: no    Last Vitals:  Vitals:   07/23/16 0738  BP: (!) 145/107  Resp: 17    Last Pain:  Vitals:   07/23/16 0738  TempSrc: Oral   Pain Goal:                 Zurisadai Helminiak Barron Alvine

## 2016-07-23 NOTE — Anesthesia Preprocedure Evaluation (Signed)
Anesthesia Evaluation  Patient identified by MRN, date of birth, ID band Patient awake    Reviewed: Allergy & Precautions, NPO status , Patient's Chart, lab work & pertinent test results  Airway Mallampati: II       Dental no notable dental hx. (+) Teeth Intact   Pulmonary neg pulmonary ROS,    Pulmonary exam normal        Cardiovascular + dysrhythmias Atrial Fibrillation  Rhythm:Irregular Rate:Normal     Neuro/Psych negative neurological ROS  negative psych ROS   GI/Hepatic Neg liver ROS, GERD  Medicated and Controlled,  Endo/Other  negative endocrine ROS  Renal/GU negative Renal ROS     Musculoskeletal negative musculoskeletal ROS (+)   Abdominal (+) + obese,   Peds  Hematology negative hematology ROS (+)   Anesthesia Other Findings [place]  EXERCISE TOLERANCE TEST  Order# [id number]  Reading physician: Josue Hector, MD Ordering physician: Sanda Klein, MD Study date: 06/26/16 Patient Information   Name MRN Description [name] [name] [id number] 66 y.o. Male Result Notes   Notes recorded by Pixie Casino, MD on 06/27/2016 at 3:10 PM EDT A-fib - no ischemia with exercise stress testing or QRS widening.  Dr. Lemmie Evens   Vitals   Height Weight BMI (Calculated) 5\' 8"  (1.727 m) 249 lb (112.9 kg) 37.9 Study Highlights     Blood pressure demonstrated a hypertensive response to exercise.  There was no ST segment deviation noted during stress.   Baseline Rhythm afib Exaggerate HR response to exercise No ischemia or QRS wide ing   Stress Findings   ECG Baseline ECG indicates atrial fibrillation. .  Stress Findings The patient exercised following the Bruce protocol.  The patient reported no symptoms during the stress test. The patient experienced no angina during the stress test.   The patient requested the test to be stopped. Test was stopped per protocol.   Blood pressure demonstrated a  hypertensive response to exercise. Heart rate demonstrated an exaggerated response to exercise. Overall, the patient's exercise capacity was moderately impaired.   85% of maximum heart rate was achieved after 1.5 minutes. Recovery time: 5 minutes. The patient's response to exercise was adequate for diagnosis.  Response to Stress There was no ST segment deviation noted during stress.  Arrhythmias during stress: atrial fibrillation.  Arrhythmias during recovery: atrial fibrillation.  Arrhythmias were not significant.  ECG was uninterpretable.  Stress Measurements   Baseline Vitals Rest HR  112 bpm  Rest BP  107/78 mmHg  Exercise Time Exercise duration (min)  3 min  Exercise duration (sec)  0 sec  Peak Stress Vitals Peak HR  173 bpm  Peak BP  172/111 mmHg  Exercise Data MPHR  156 bpm  Percent HR  110 %  RPE  16   Estimated workload  4.6 METS    Signed   Electronically signed by Josue Hector, MD on 06/26/16 at 1551 EDT Report approved and finalized on 06/26/2016 1550 Imaging   Imaging Information Order-Level Documents - 06/26/2016:   Scan on 06/26/2016 2:51 PM by Default, Provider, MDScan on 06/26/2016 2:51 PM by Default, Provider, MD  Scan on 06/26/2016 4:34 PM by Sallee Provencal L : Exercise Tolerance Test Worksheet & Consent Form - [facility] on 06/26/2016 4:34 PM by Sallee Provencal L : Exercise Tolerance Test Worksheet & Consent Form - [place] - 06/26/2016:   Electronic signature on 06/26/2016 12:40 PM    Exam Information   Status Exam [name]  Exam Ended  Final (99) 06/26/2016 2:11 PM 06/26/2016 2:45 PM External Result Report   External Result Report Encounter Report   Patient Encounter Report  LYALL FACIANE  ECHO COMPLETE W IMAGE ENHANCING AGENT  Order# [id number]  Reading physician: Jerline Pain, MD Ordering physician: Sanda Klein, MD Study date: 06/26/16 Result Notes   Notes  recorded by Pixie Casino, MD on 06/27/2016 at 3:29 PM EDT A-fib - EF 40-45% with globally reduced systolic function.  Dr. Lemmie Evens (for Dr. Loletha Grayer)   Study Result   Result status: Final result                          [facility]                        [address], [place] [address]                            [phone]  ------------------------------------------------------------------- Transthoracic Echocardiography  Patient:    [name], [name] MR #:       [id number] Study Date: 06/26/2016 Gender:     M Age:        66 Height:     172.7 cm Weight:     115.2 kg BSA:        2.4 m^2 Pt. Status: Room:   Darel Hong [id number]  ATTENDING    Candee Furbish, M.D.  SONOGRAPHER  Cindy Hazy, [place] Croitoru, MD  REFERRING    Sanda Klein, MD  PERFORMING   [facility], Outpatient  cc:  ------------------------------------------------------------------- LV EF: 40% -   45%  ------------------------------------------------------------------- Indications:      I48.91 Atrial Fibrillation.  ECHO WITH DEFINITY.   ------------------------------------------------------------------- History:   PMH:  Acquired from the patient and from the patient&'s chart.  Risk factors:  Dyslipidemia.  ------------------------------------------------------------------- Study Conclusions  - Left ventricle: The cavity size was normal. Systolic function was   mildly to moderately reduced. The estimated ejection fraction was   in the range of 40% to 45%. Diffuse hypokinesis. The study is not   technically sufficient to allow evaluation of LV diastolic   function. - Aortic valve: Trileaflet; mildly thickened, mildly calcified   leaflets.  ------------------------------------------------------------------- Study data:   Study status:  Routine.  Procedure:  The patient reported no pain pre or post test. Transthoracic  echocardiography for left ventricular function evaluation, for right ventricular function evaluation, and for assessment of valvular function. Image quality was adequate.  Study completion:  There were no complications.          Transthoracic echocardiography.  M-mode, complete 2D, spectral Doppler, and color Doppler.  Birthdate: Patient birthdate: 23-Nov-1950.  Age:  Patient is 66 yr old.  Sex: Gender: male.    BMI: 38.6 kg/m^2.  Blood pressure:     110/84 Patient status:  Outpatient.  Study date:  Study date: 06/26/2016. Study time: 01:19 PM.  Location:   [facility]  -------------------------------------------------------------------  ------------------------------------------------------------------- Left ventricle:  The cavity size was normal. Systolic function was mildly to moderately reduced. The estimated ejection fraction was in the range of 40% to 45%. Diffuse hypokinesis. The study is not technically sufficient to  allow evaluation of LV diastolic function.  ------------------------------------------------------------------- Aortic valve:   Trileaflet; mildly thickened, mildly calcified leaflets. Mobility was not restricted.  Doppler:  Transvalvular velocity was within the normal range. There was no stenosis. There was no regurgitation.  ------------------------------------------------------------------- Aorta:  Aortic root: The aortic root was normal in size.  ------------------------------------------------------------------- Mitral valve:   Structurally normal valve.   Mobility was not restricted.  Doppler:  Transvalvular velocity was within the normal range. There was no evidence for stenosis. There was no regurgitation.    Peak gradient (D): 5 mm Hg.  ------------------------------------------------------------------- Left atrium:  The atrium was normal in size.  ------------------------------------------------------------------- Right ventricle:  The  cavity size was normal. Wall thickness was normal. Systolic function was normal.  ------------------------------------------------------------------- Pulmonic valve:   Poorly visualized.  Structurally normal valve. Cusp separation was normal.  Doppler:  Transvalvular velocity was within the normal range. There was no evidence for stenosis. There was no regurgitation.  ------------------------------------------------------------------- Tricuspid valve:   Structurally normal valve.    Doppler: Transvalvular velocity was within the normal range. There was no regurgitation.  ------------------------------------------------------------------- Pulmonary artery:   The main pulmonary artery was normal-sized. Systolic pressure was within the normal range.  ------------------------------------------------------------------- Right atrium:  The atrium was normal in size.  ------------------------------------------------------------------- Pericardium:  There was no pericardial effusion.  ------------------------------------------------------------------- Systemic veins: Inferior vena cava: The vessel was normal in size.  ------------------------------------------------------------------- Measurements   Left ventricle                         Value        Reference  LV ID, ED, PLAX chordal        (H)     53.9  mm     43 - 52  LV ID, ES, PLAX chordal        (H)     41.5  mm     23 - 38  LV fx shortening, PLAX chordal (L)     23    %      >=29  LV PW thickness, ED                    11.7  mm     ---------  IVS/LV PW ratio, ED                    0.78         <=1.3  Stroke volume, 2D                      53    ml     ---------  Stroke volume/bsa, 2D                  22    ml/m^2 ---------    Ventricular septum                     Value        Reference  IVS thickness, ED                      9.18  mm     ---------    LVOT                                   Value        Reference  LVOT ID, S                             22    mm     ---------  LVOT area                              3.8   cm^2   ---------  LVOT ID                                22    mm     ---------  LVOT peak velocity, S                  75.2  cm/s   ---------  LVOT mean velocity, S                  52.9  cm/s   ---------  LVOT VTI, S                            13.9  cm     ---------  LVOT peak gradient, S                  2     mm Hg  ---------  Stroke volume (SV), LVOT DP            52.8  ml     ---------  Stroke index (SV/bsa), LVOT DP         22    ml/m^2 ---------    Aorta                                  Value        Reference  Aortic root ID, ED                     33    mm     ---------  Ascending aorta ID, A-P, S             36    mm     ---------    Left atrium                            Value        Reference  LA ID, A-P, ES                         52    mm     ---------  LA ID/bsa, A-P                         2.17  cm/m^2 <=2.2  LA volume, ES, 1-p A4C                 64    ml     ---------  LA volume/bsa, ES, 1-p A4C             26.6  ml/m^2 ---------  LA volume, ES, 1-p A2C                 47    ml     ---------  LA volume/bsa, ES, 1-p A2C             19.6  ml/m^2 ---------    Mitral valve                           Value        Reference  Mitral E-wave peak velocity            109   cm/s   ---------  Mitral deceleration time               155   ms     150 - 230  Mitral peak gradient, D                5     mm Hg  ---------  Legend: (L)  and  (H)  mark values outside specified reference range.  ------------------------------------------------------------------- Prepared and Electronically Authenticated by  Candee Furbish, M.D. 2018-06-12T17:11:31 PACS Images   Show images for ECHOCARDIOGRAM COMPLETE Patient Information   Patient Name Alixander, Rallis Sex Male DOB 1950/08/05 SSN [id number] Reason For Exam  Priority: Routine  Dx: Atrial fibrillation, unspecified type  ([place]) (I48.91 (ICD-10-CM)) Surgical History   Surgical History    No past medical history on file.  Other Surgical History    No past medical history on file.  Performing Technologist/Nurse   Performing Technologist/Nurse: Rodgers-Jones, Natashia          Implants     No active implants to display in this view. Order-Level Documents:   There are no order-level documents.  Encounter-Level Documents - 06/26/2016:   Electronic signature on 06/26/2016 12:38 PM  Electronic signature on 06/26/2016 12:38 PM    Signed   Electronically signed by Jerline Pain, MD on 06/26/16 at 1711 EDT Printable Result Report    Result Report  External Result Report    External Result Report     Reproductive/Obstetrics                             Anesthesia Physical Anesthesia Plan  ASA: II  Anesthesia Plan: General   Post-op Pain Management:    Induction: Intravenous  PONV Risk Score and Plan: 2 and Ondansetron and Dexamethasone  Airway Management Planned: Natural Airway and Simple Face Mask  Additional Equipment:   Intra-op Plan:   Post-operative Plan:   Informed Consent: I have reviewed the patients History and Physical, chart, labs and discussed the procedure including the risks, benefits and alternatives for the proposed anesthesia with the patient or authorized representative who has indicated his/her understanding and acceptance.     Plan Discussed with: CRNA and Surgeon  Anesthesia Plan Comments:         Anesthesia Quick Evaluation

## 2016-07-23 NOTE — Progress Notes (Signed)
Roughly 5 minutes after the DCCV, the rhythm converted back to atrial fibrillation. Rate control remains good. He reports decreased energy since May, presumably the arrhythmia onset. Will refer for EP evaluation for RF ablation versus cardioversion.  Sanda Klein, MD, [facility] [phone] office [phone] pager 07/23/2016 9:36 AM

## 2016-07-23 NOTE — Op Note (Signed)
Procedure: Electrical Cardioversion Indications:  Atrial Fibrillation  Procedure Details:  Consent: Risks of procedure as well as the alternatives and risks of each were explained to the (patient/caregiver).  Consent for procedure obtained.  Time Out: Verified patient identification, verified procedure, site/side was marked, verified correct patient position, special equipment/implants available, medications/allergies/relevent history reviewed, required imaging and test results available.  Performed  Patient placed on cardiac monitor, pulse oximetry, supplemental oxygen as necessary.  Sedation given: propofol 90 mg IV Pacer pads placed anterior and posterior chest.  Cardioverted 1 time(s).  Cardioversion with synchronized biphasic 120J shock.  Evaluation: Findings: Post procedure EKG shows: NSR Complications: None Patient did tolerate procedure well.  Time Spent Directly with the Patient:  30 minutes   [name] [name] 07/23/2016, 9:10 AM

## 2016-07-23 NOTE — Discharge Instructions (Signed)
Electrical Cardioversion, Care After °This sheet gives you information about how to care for yourself after your procedure. Your health care provider may also give you more specific instructions. If you have problems or questions, contact your health care provider. °What can I expect after the procedure? °After the procedure, it is common to have: °· Some redness on the skin where the shocks were given. ° °Follow these instructions at home: °· Do not drive for 24 hours if you were given a medicine to help you relax (sedative). °· Take over-the-counter and prescription medicines only as told by your health care provider. °· Ask your health care provider how to check your pulse. Check it often. °· Rest for 48 hours after the procedure or as told by your health care provider. °· Avoid or limit your caffeine use as told by your health care provider. °Contact a health care provider if: °· You feel like your heart is beating too quickly or your pulse is not regular. °· You have a serious muscle cramp that does not go away. °Get help right away if: °· You have discomfort in your chest. °· You are dizzy or you feel faint. °· You have trouble breathing or you are short of breath. °· Your speech is slurred. °· You have trouble moving an arm or leg on one side of your body. °· Your fingers or toes turn cold or blue. °This information is not intended to replace advice given to you by your health care provider. Make sure you discuss any questions you have with your health care provider. °Document Released: 10/22/2012 Document Revised: 08/05/2015 Document Reviewed: 07/08/2015 °Elsevier Interactive Patient Education © 2018 Elsevier Inc. ° °

## 2016-07-23 NOTE — Interval H&P Note (Signed)
History and Physical Interval Note:  07/23/2016 7:47 AM  [name] [name]  has presented today for surgery, with the diagnosis of A-FIB  The various methods of treatment have been discussed with the patient and family. After consideration of risks, benefits and other options for treatment, the patient has consented to  Procedure(s): CARDIOVERSION (N/A) as a surgical intervention .  The patient's history has been reviewed, patient examined, no change in status, stable for surgery.  I have reviewed the patient's chart and labs.  Questions were answered to the patient's satisfaction.     Flecia Shutter

## 2016-07-25 ENCOUNTER — Telehealth: Payer: Self-pay | Admitting: Cardiovascular Disease

## 2016-07-25 NOTE — Telephone Encounter (Signed)
Closed Encounter  °

## 2016-08-02 ENCOUNTER — Ambulatory Visit (INDEPENDENT_AMBULATORY_CARE_PROVIDER_SITE_OTHER): Payer: BLUE CROSS/BLUE SHIELD | Admitting: Cardiology

## 2016-08-02 ENCOUNTER — Encounter (INDEPENDENT_AMBULATORY_CARE_PROVIDER_SITE_OTHER): Payer: Self-pay

## 2016-08-02 ENCOUNTER — Encounter: Payer: Self-pay | Admitting: Cardiology

## 2016-08-02 ENCOUNTER — Other Ambulatory Visit: Payer: Self-pay | Admitting: Cardiology

## 2016-08-02 ENCOUNTER — Encounter: Payer: Self-pay | Admitting: *Deleted

## 2016-08-02 VITALS — BP 118/82 | HR 78 | Ht 68.0 in | Wt 246.8 lb

## 2016-08-02 DIAGNOSIS — E782 Mixed hyperlipidemia: Secondary | ICD-10-CM

## 2016-08-02 DIAGNOSIS — Z01812 Encounter for preprocedural laboratory examination: Secondary | ICD-10-CM

## 2016-08-02 DIAGNOSIS — I481 Persistent atrial fibrillation: Secondary | ICD-10-CM | POA: Diagnosis not present

## 2016-08-02 DIAGNOSIS — I4819 Other persistent atrial fibrillation: Secondary | ICD-10-CM

## 2016-08-02 MED ORDER — RIVAROXABAN 20 MG PO TABS: 20.0000 mg | ORAL_TABLET | Freq: Every day | ORAL | 3 refills | Status: DC

## 2016-08-02 MED ORDER — RIVAROXABAN 20 MG PO TABS: 20.0000 mg | ORAL_TABLET | Freq: Every day | ORAL | 6 refills | Status: DC

## 2016-08-02 NOTE — Progress Notes (Addendum)
Electrophysiology Office Note   Date:  08/02/2016   ID:  DEKOTA SHENK, DOB August 13, 1950, MRN [id number]  PCP:  Mayra Neer, MD  Cardiologist:  [facility] Primary Electrophysiologist:  Maddon Horton Meredith Leeds, MD    Chief Complaint  Patient presents with  . Advice Only    Persistent Afib/Discuss Afib ablation     History of Present Illness: MONTERRIO GERST is a 66 y.o. male who is being seen today for the evaluation of atrial fibrillation at the request of Mayra Neer, MD. Presenting today for electrophysiology evaluation. He has a history of hyperlipidemia, gastroparesis esophageal reflux, and remote melanoma resection. He recently was diagnosed with atrial fibrillation and an office visit on May 16. He was having irregularity of his heartbeat for many years at the time. EKG performed in January showed sinus rhythm. He had a cardioversion on 07/23/16. He converted to sinus rhythm, but quickly went back into atrial fibrillation. He has had decreased energy and shortness of breath since May. He is usually quite active, but has been unable to be as active as he would like.   Today, he denies symptoms of palpitations, chest pain, orthopnea, PND, lower extremity edema, claudication, dizziness, presyncope, syncope, bleeding, or neurologic sequela. The patient is tolerating medications without difficulties.    Past Medical History:  Diagnosis Date  . Cancer ([place])   . GERD (gastroesophageal reflux disease)   . Hyperlipidemia    Past Surgical History:  Procedure Laterality Date  . CARDIOVERSION N/A 07/23/2016   Procedure: CARDIOVERSION;  Surgeon: Sanda Klein, MD;  Location: [facility];  Service: Cardiovascular;  Laterality: N/A;     Current Outpatient Prescriptions  Medication Sig Dispense Refill  . BUPROPION HCL PO Take 150 mg by mouth daily.    [name] omeprazole (PRILOSEC) 20 MG capsule Take 20 mg by mouth daily.    . Pitavastatin Calcium (LIVALO) 2 MG TABS Take 1 tablet by mouth  daily.    . rivaroxaban (XARELTO) 20 MG TABS tablet Take 1 tablet (20 mg total) by mouth daily with supper. 30 tablet 6   No current facility-administered medications for this visit.     Allergies:   Patient has no known allergies.   Social History:  The patient  reports that he has never smoked. He has never used smokeless tobacco. He reports that he drinks alcohol. He reports that he does not use drugs.   Family History:  The patient's family history includes Alzheimer's disease in his sister; Heart Problems in his brother, brother, and father; Stroke in his brother.    ROS:  Please see the history of present illness.   Otherwise, review of systems is positive for Sweats, fatigue, chest pain, palpitations, hearing loss, visual disturbance, cough, dyspnea on exertion, depression, anxiety, difficulty urinating.   All other systems are reviewed and negative.    PHYSICAL EXAM: VS:  BP 118/82   Pulse 78   Ht 5\' 8"  (1.727 m)   Wt 246 lb 12.8 oz (111.9 kg)   SpO2 98%   BMI 37.53 kg/m  , BMI Body mass index is 37.53 kg/m. GEN: Well nourished, well developed, in no acute distress  HEENT: normal  Neck: no JVD, carotid bruits, or masses Cardiac: RRR; no murmurs, rubs, or gallops,no edema  Respiratory:  clear to auscultation bilaterally, normal work of breathing GI: soft, nontender, nondistended, + BS MS: no deformity or atrophy  Skin: warm and dry Neuro:  Strength and sensation are intact Psych: euthymic mood, full affect  EKG:  EKG is ordered today. Personal review of the ekg ordered 07/23/16 shows atrial fibrillation, rate 93  Recent Labs: 07/10/2016: Hemoglobin 15.9; Platelets 172; TSH 2.080 07/23/2016: BUN 15; Creatinine, Ser 1.16; Magnesium 2.0; Potassium 4.0; Sodium 135    Lipid Panel  No results found for: CHOL, TRIG, HDL, CHOLHDL, VLDL, LDLCALC, LDLDIRECT   Wt Readings from Last 3 Encounters:  08/02/16 246 lb 12.8 oz (111.9 kg)  07/23/16 249 lb (112.9 kg)  07/03/16 249 lb  (112.9 kg)      Other studies Reviewed: Additional studies/ records that were reviewed today include: TTE 06/26/16  Review of the above records today demonstrates:  - Left ventricle: The cavity size was normal. Systolic function was   mildly to moderately reduced. The estimated ejection fraction was   in the range of 40% to 45%. Diffuse hypokinesis. The study is not   technically sufficient to allow evaluation of LV diastolic   function. - Aortic valve: Trileaflet; mildly thickened, mildly calcified   leaflets.   ASSESSMENT AND PLAN:  1.  Atrial fibrillation: Recent cardioversion on 07/23/16 which resulted in maintenance of sinus rhythm for a short time but quickly returned to atrial fibrillation. He is on Xarelto his anticoagulation. We discussed options of ablation versus medical management. Risks and benefits of ablation were discussed. Risks include bleeding, tamponade, heart block, stroke, and damage to surrounding organs among others. He does not want to be on any new medications, and thus would prefer ablation.  This patients CHA2DS2-VASc Score and unadjusted Ischemic Stroke Rate (% per year) is equal to 0.6 % stroke rate/year from a score of 1  Above score calculated as 1 point each if present [CHF, HTN, DM, Vascular=MI/PAD/Aortic Plaque, Age if 65-74, or Male] Above score calculated as 2 points each if present [Age > 75, or Stroke/TIA/TE]  2. Hyperlipidemia: Intolerant to all statins except Livalo  3. Cardiomyopathy, likely nonischemic: Unclear cause. His ejection fraction may improve with resumption of sinus rhythm. Saudia Smyser likely need a repeat echocardiogram done the road.  Current medicines are reviewed at length with the patient today.   The patient does not have concerns regarding his medicines.  The following changes were made today:  none  Labs/ tests ordered today include:  Orders Placed This Encounter  Procedures  . CT CARDIAC MORPH/PULM VEIN W/CM&W/O CA SCORE  . CT  CORONARY FRACTIONAL FLOW RESERVE DATA PREP  . CT CORONARY FRACTIONAL FLOW RESERVE FLUID ANALYSIS  . Basic Metabolic Panel (BMET)  . CBC w/Diff     Disposition:   FU with Davarius Ridener 1 month  Signed, Epsie Walthall Meredith Leeds, MD  08/02/2016 10:21 AM     [facility] [address] [place] [address] [phone] (office) [phone] (fax)

## 2016-08-02 NOTE — Patient Instructions (Signed)
Medication Instructions:    Your physician recommends that you continue on your current medications as directed. Please refer to the Current Medication list given to you today.   MAKE SURE YOU CONTINUE TAKING YOUR XARELTO.  DO NOT STOP THIS MEDICATION PRIOR TO YOUR ABLATION  - If you need a refill on your cardiac medications before your next appointment, please call your pharmacy.   Labwork:  None  ordered  Testing/Procedures: Your physician has requested that you have cardiac CT - within 7 days prior to your ablation procedure on 09/07/16. Cardiac computed tomography (CT) is a painless test that uses an x-ray machine to take clear, detailed pictures of your heart. For further information please visit [email or web address]. Please follow instruction sheet as given.  Your physician has recommended that you have an ablation. Catheter ablation is a medical procedure used to treat some cardiac arrhythmias (irregular heartbeats). During catheter ablation, a long, thin, flexible tube is put into a blood vessel in your groin (upper thigh), or neck. This tube is called an ablation catheter. It is then guided to your heart through the blood vessel. Radio frequency waves destroy small areas of heart tissue where abnormal heartbeats may cause an arrhythmia to start. Please see the instruction sheet given to you today.  Follow-Up:  Your physician recommends that you schedule a follow-up appointment between: 8/10 - 8/22 with Dr. Curt Bears.   Your physician recommends that you schedule a follow-up appointment in: 4 weeks, after your procedure on 09/07/16, with Roderic Palau in the AFib clinic.   Your physician recommends that you schedule a follow-up appointment in: 3 months, after your procedure on 09/07/16,  with Dr. Curt Bears  Thank you for choosing [facility]!!   Trinidad Curet, RN [phone]  Any Other Special Instructions Will Be Listed Below (If Applicable).   Cardiac Ablation Cardiac  ablation is a procedure to disable (ablate) a small amount of heart tissue in very specific places. The heart has many electrical connections. Sometimes these connections are abnormal and can cause the heart to beat very fast or irregularly. Ablating some of the problem areas can improve the heart rhythm or return it to normal. Ablation may be done for people who:  Have Wolff-Parkinson-White syndrome.  Have fast heart rhythms (tachycardia).  Have taken medicines for an abnormal heart rhythm (arrhythmia) that were not effective or caused side effects.  Have a high-risk heartbeat that may be life-threatening.  During the procedure, a small incision is made in the neck or the groin, and a long, thin, flexible tube (catheter) is inserted into the incision and moved to the heart. Small devices (electrodes) on the tip of the catheter will send out electrical currents. A type of X-ray (fluoroscopy) will be used to help guide the catheter and to provide images of the heart. Tell a health care provider about:  Any allergies you have.  All medicines you are taking, including vitamins, herbs, eye drops, creams, and over-the-counter medicines.  Any problems you or family members have had with anesthetic medicines.  Any blood disorders you have.  Any surgeries you have had.  Any medical conditions you have, such as kidney failure.  Whether you are pregnant or may be pregnant. What are the risks? Generally, this is a safe procedure. However, problems may occur, including:  Infection.  Bruising and bleeding at the catheter insertion site.  Bleeding into the chest, especially into the sac that surrounds the heart. This is a serious complication.  Stroke or blood clots.  Damage to other structures or organs.  Allergic reaction to medicines or dyes.  Need for a permanent pacemaker if the normal electrical system is damaged. A pacemaker is a small computer that sends electrical signals to the  heart and helps your heart beat normally.  The procedure not being fully effective. This may not be recognized until months later. Repeat ablation procedures are sometimes required.  What happens before the procedure?  Follow instructions from your health care provider about eating or drinking restrictions.  Ask your health care provider about: ? Changing or stopping your regular medicines. This is especially important if you are taking diabetes medicines or blood thinners. ? Taking medicines such as aspirin and ibuprofen. These medicines can thin your blood. Do not take these medicines before your procedure if your health care provider instructs you not to.  Plan to have someone take you home from the hospital or clinic.  If you will be going home right after the procedure, plan to have someone with you for 24 hours. What happens during the procedure?  To lower your risk of infection: ? Your health care team will wash or sanitize their hands. ? Your skin will be washed with soap. ? Hair may be removed from the incision area.  An IV tube will be inserted into one of your veins.  You will be given a medicine to help you relax (sedative).  The skin on your neck or groin will be numbed.  An incision will be made in your neck or your groin.  A needle will be inserted through the incision and into a large vein in your neck or groin.  A catheter will be inserted into the needle and moved to your heart.  Dye may be injected through the catheter to help your surgeon see the area of the heart that needs treatment.  Electrical currents will be sent from the catheter to ablate heart tissue in desired areas. There are three types of energy that may be used to ablate heart tissue: ? Heat (radiofrequency energy). ? Laser energy. ? Extreme cold (cryoablation).  When the necessary tissue has been ablated, the catheter will be removed.  Pressure will be held on the catheter insertion area  to prevent excessive bleeding.  A bandage (dressing) will be placed over the catheter insertion area. The procedure may vary among health care providers and hospitals. What happens after the procedure?  Your blood pressure, heart rate, breathing rate, and blood oxygen level will be monitored until the medicines you were given have worn off.  Your catheter insertion area will be monitored for bleeding. You will need to lie still for a few hours to ensure that you do not bleed from the catheter insertion area.  Do not drive for 24 hours or as long as directed by your health care provider. Summary  Cardiac ablation is a procedure to disable (ablate) a small amount of heart tissue in very specific places. Ablating some of the problem areas can improve the heart rhythm or return it to normal.  During the procedure, electrical currents will be sent from the catheter to ablate heart tissue in desired areas. This information is not intended to replace advice given to you by your health care provider. Make sure you discuss any questions you have with your health care provider. Document Released: 05/20/2008 Document Revised: 11/21/2015 Document Reviewed: 11/21/2015 Elsevier Interactive Patient Education  [employer].

## 2016-08-02 NOTE — Addendum Note (Signed)
Addended by: Stanton Kidney on: 08/02/2016 10:18 AM   Modules accepted: Orders

## 2016-08-02 NOTE — Addendum Note (Signed)
Addended by: Stanton Kidney on: 08/02/2016 12:38 PM   Modules accepted: Orders

## 2016-08-06 ENCOUNTER — Telehealth: Payer: Self-pay | Admitting: Cardiology

## 2016-08-06 NOTE — Telephone Encounter (Signed)
New message  Pt wife called requesting to speak with RN about pts procedure and his blood work. Please call back to discuss

## 2016-08-06 NOTE — Telephone Encounter (Signed)
Wife calling to ask what I called and informed him on Friday (ok to speak to wife per husband and DPR) - she states that he can't remember.   Explained that I only called to let them know we cancelled appts with [facility] office next month and they will only have to see Dr. Curt Bears next month (this was their preference also).   She giggles and verbalized understanding.  States call me from now on cause he can't remember anything.

## 2016-08-14 ENCOUNTER — Telehealth: Payer: Self-pay | Admitting: Cardiology

## 2016-08-14 NOTE — Telephone Encounter (Signed)
Pt's wife (ok per pt/DPR). Wife just following up on scheduling cardiac CT and pre labs. Informed that office will call her soon to arrange. She thanks me for the info.

## 2016-08-14 NOTE — Telephone Encounter (Signed)
New message       Talk to the nurse regarding upcoming procedure.  Calling to get CT scan and lab information.  Pt needs these test prior to procedure but they have questions.  Please call

## 2016-08-15 ENCOUNTER — Encounter: Payer: Self-pay | Admitting: Cardiology

## 2016-08-20 ENCOUNTER — Ambulatory Visit: Payer: BLUE CROSS/BLUE SHIELD | Admitting: Cardiology

## 2016-08-31 ENCOUNTER — Encounter (HOSPITAL_COMMUNITY): Payer: Self-pay

## 2016-08-31 ENCOUNTER — Ambulatory Visit (HOSPITAL_COMMUNITY)
Admission: RE | Admit: 2016-08-31 | Discharge: 2016-08-31 | Disposition: A | Discharge status: Home / Self Care | Payer: BLUE CROSS/BLUE SHIELD | Source: Ambulatory Visit | Attending: Cardiology | Admitting: Cardiology

## 2016-08-31 ENCOUNTER — Ambulatory Visit (HOSPITAL_COMMUNITY): Admission: RE | Admit: 2016-08-31 | Payer: BLUE CROSS/BLUE SHIELD | Source: Ambulatory Visit

## 2016-08-31 DIAGNOSIS — I4891 Unspecified atrial fibrillation: Secondary | ICD-10-CM | POA: Diagnosis not present

## 2016-08-31 DIAGNOSIS — I4819 Other persistent atrial fibrillation: Secondary | ICD-10-CM

## 2016-08-31 DIAGNOSIS — I481 Persistent atrial fibrillation: Secondary | ICD-10-CM | POA: Diagnosis not present

## 2016-08-31 DIAGNOSIS — I700 Atherosclerosis of aorta: Secondary | ICD-10-CM | POA: Insufficient documentation

## 2016-08-31 MED ORDER — NITROGLYCERIN 0.4 MG SL SUBL
SUBLINGUAL_TABLET | SUBLINGUAL | Status: AC
  Filled 2016-08-31: qty 1

## 2016-08-31 MED ORDER — METOPROLOL TARTRATE 5 MG/5ML IV SOLN
INTRAVENOUS | Status: AC
  Administered 2016-08-31: 5 mg via INTRAVENOUS
  Filled 2016-08-31: qty 5

## 2016-08-31 MED ORDER — METOPROLOL TARTRATE 5 MG/5ML IV SOLN
5.0000 mg | Freq: Once | INTRAVENOUS | Status: AC
  Administered 2016-08-31: 5 mg via INTRAVENOUS

## 2016-08-31 MED ORDER — IOPAMIDOL (ISOVUE-370) INJECTION 76%
INTRAVENOUS | Status: AC
  Administered 2016-08-31: 80 mL
  Filled 2016-08-31: qty 100

## 2016-08-31 MED ORDER — NITROGLYCERIN 0.4 MG SL SUBL
0.4000 mg | SUBLINGUAL_TABLET | Freq: Once | SUBLINGUAL | Status: AC
  Administered 2016-08-31: 0.4 mg via SUBLINGUAL

## 2016-09-04 ENCOUNTER — Ambulatory Visit (INDEPENDENT_AMBULATORY_CARE_PROVIDER_SITE_OTHER): Payer: BLUE CROSS/BLUE SHIELD | Admitting: Cardiology

## 2016-09-04 ENCOUNTER — Encounter: Payer: Self-pay | Admitting: Cardiology

## 2016-09-04 VITALS — BP 100/62 | HR 74 | Ht 68.0 in | Wt 243.6 lb

## 2016-09-04 DIAGNOSIS — I4819 Other persistent atrial fibrillation: Secondary | ICD-10-CM

## 2016-09-04 DIAGNOSIS — I428 Other cardiomyopathies: Secondary | ICD-10-CM | POA: Diagnosis not present

## 2016-09-04 DIAGNOSIS — I481 Persistent atrial fibrillation: Secondary | ICD-10-CM

## 2016-09-04 DIAGNOSIS — Z01812 Encounter for preprocedural laboratory examination: Secondary | ICD-10-CM

## 2016-09-04 DIAGNOSIS — E785 Hyperlipidemia, unspecified: Secondary | ICD-10-CM

## 2016-09-04 NOTE — Progress Notes (Signed)
Electrophysiology Office Note   Date:  09/04/2016   ID:  [name], [name] March 13, 1950, MRN [id number]  PCP:  Mayra Neer, MD  Cardiologist:  C[facility] Primary Electrophysiologist:  Aleigha Gilani Meredith Leeds, MD    Chief Complaint  Patient presents with  . Follow-up    Persistent Afib/H&P      History of Present Illness: [name] [name] is a 66 y.o. male who is being seen today for the evaluation of atrial fibrillation at the request of Mayra Neer, MD. Presenting today for electrophysiology evaluation. He has a history of hyperlipidemia, gastroparesis esophageal reflux, and remote melanoma resection. He recently was diagnosed with atrial fibrillation and an office visit on May 16. He was having irregularity of his heartbeat for many years at the time. EKG performed in January showed sinus rhythm. He had a cardioversion on 07/23/16. He converted to sinus rhythm, but quickly went back into atrial fibrillation. He has had decreased energy and shortness of breath since May.   Today, denies symptoms of palpitations,  shortness of breath, orthopnea, PND, lower extremity edema, claudication, dizziness, presyncope, syncope, bleeding, or neurologic sequela. The patient is tolerating medications without difficulties and is otherwise without complaint today.  Plan for atrial fibrillation ablation on 09/07/16. Does have occasional chest pain sometimes with exertion. CT scan did show coronary calcification. Currently chest pain-free.   Past Medical History:  Diagnosis Date  . Cancer ([place])   . GERD (gastroesophageal reflux disease)   . Hyperlipidemia    Past Surgical History:  Procedure Laterality Date  . CARDIOVERSION N/A 07/23/2016   Procedure: CARDIOVERSION;  Surgeon: Sanda Klein, MD;  Location: [facility];  Service: Cardiovascular;  Laterality: N/A;     Current Outpatient Prescriptions  Medication Sig Dispense Refill  . BUPROPION HCL PO Take 150 mg by mouth daily.    [name] omeprazole  (PRILOSEC) 20 MG capsule Take 20 mg by mouth daily.    . Pitavastatin Calcium (LIVALO) 2 MG TABS Take 1 tablet by mouth daily.    . rivaroxaban (XARELTO) 20 MG TABS tablet Take 1 tablet (20 mg total) by mouth daily with supper. 90 tablet 3   No current facility-administered medications for this visit.     Allergies:   Patient has no known allergies.   Social History:  The patient  reports that he has never smoked. He has never used smokeless tobacco. He reports that he drinks alcohol. He reports that he does not use drugs.   Family History:  The patient's family history includes Alzheimer's disease in his sister; Heart Problems in his brother, brother, and father; Stroke in his brother.    ROS:  Please see the history of present illness.   Otherwise, review of systems is positive for Weight loss, fatigue, sweating, chest pain, leg swelling, palpitations, leg pain, cough, constipation, anxiety, difficulty urinating, muscle pain.   All other systems are reviewed and negative.   PHYSICAL EXAM: VS:  BP 100/62   Pulse 74   Ht 5\' 8"  (1.727 m)   Wt 243 lb 9.6 oz (110.5 kg)   SpO2 98%   BMI 37.04 kg/m  , BMI Body mass index is 37.04 kg/m. GEN: Well nourished, well developed, in no acute distress  HEENT: normal  Neck: no JVD, carotid bruits, or masses Cardiac: iRRR; no murmurs, rubs, or gallops,no edema  Respiratory:  clear to auscultation bilaterally, normal work of breathing GI: soft, nontender, nondistended, + BS MS: no deformity or atrophy  Skin: warm and dry Neuro:  Strength and sensation are intact Psych: euthymic mood, full affect  EKG:  EKG is not ordered today. Personal review of the ekg ordered 07/23/16 shows AF, low voltage   Recent Labs: 07/10/2016: Hemoglobin 15.9; Platelets 172; TSH 2.080 07/23/2016: BUN 15; Creatinine, Ser 1.16; Magnesium 2.0; Potassium 4.0; Sodium 135    Lipid Panel  No results found for: CHOL, TRIG, HDL, CHOLHDL, VLDL, LDLCALC, LDLDIRECT   Wt  Readings from Last 3 Encounters:  09/04/16 243 lb 9.6 oz (110.5 kg)  08/02/16 246 lb 12.8 oz (111.9 kg)  07/23/16 249 lb (112.9 kg)      Other studies Reviewed: Additional studies/ records that were reviewed today include: TTE 06/26/16  Review of the above records today demonstrates:  - Left ventricle: The cavity size was normal. Systolic function was   mildly to moderately reduced. The estimated ejection fraction was   in the range of 40% to 45%. Diffuse hypokinesis. The study is not   technically sufficient to allow evaluation of LV diastolic   function. - Aortic valve: Trileaflet; mildly thickened, mildly calcified   leaflets.   ASSESSMENT AND PLAN:  1.  Atrial fibrillation: AF ablation scheduled 09/07/16. Risks and benefits were discussed. Risks include bleeding, tamponade, heart block, stroke, and damage to surrounding organs. He understands these risks and has agreed to the procedure.  This patients CHA2DS2-VASc Score and unadjusted Ischemic Stroke Rate (% per year) is equal to 0.6 % stroke rate/year from a score of 1  Above score calculated as 1 point each if present [CHF, HTN, DM, Vascular=MI/PAD/Aortic Plaque, Age if 65-74, or Male] Above score calculated as 2 points each if present [Age > 75, or Stroke/TIA/TE]  2. Hyperlipidemia: Continue pitavastatin.  3. Cardiomyopathy, likely nonischemic: Likely nonischemic, though does have calcified coronary arteries. Potentially Kveon Casanas improve post ablation.  4. Coronary artery calcification: Seen on CT scan. Does have some episodes of chest pain. We'll plan for nuclear stress testing post ablation.  Current medicines are reviewed at length with the patient today.   The patient does not have concerns regarding his medicines.  The following changes were made today:  none  Labs/ tests ordered today include:  No orders of the defined types were placed in this encounter.    Disposition:   FU with Xavia Kniskern 3  month  Signed, Lillyth Spong Meredith Leeds, MD  09/04/2016 2:53 PM     [facility] [address] [place] [address] [phone] (office) [phone] (fax)

## 2016-09-04 NOTE — Addendum Note (Signed)
Addended by: Eulis Foster on: 09/04/2016 02:56 PM   Modules accepted: Orders

## 2016-09-04 NOTE — Patient Instructions (Signed)
Medication Instructions:  Your physician recommends that you continue on your current medications as directed. Please refer to the Current Medication list given to you today.  If you need a refill on your cardiac medications before your next appointment, please call your pharmacy.   Labwork: Pre procedure labs today: BMET & CBC w/ diff  Testing/Procedures: None ordered  Follow-Up: Keep your currently scheduled follow up appointments.   Thank you for choosing [facility]!!   Trinidad Curet, RN [phone]

## 2016-09-05 LAB — CBC WITH DIFFERENTIAL/PLATELET
BASOS: 0 %
Basophils Absolute: 0 10*3/uL (ref 0.0–0.2)
EOS (ABSOLUTE): 0.3 10*3/uL (ref 0.0–0.4)
EOS: 2 %
HEMATOCRIT: 46.4 % (ref 37.5–51.0)
HEMOGLOBIN: 15.9 g/dL (ref 13.0–17.7)
Immature Grans (Abs): 0 10*3/uL (ref 0.0–0.1)
Immature Granulocytes: 0 %
Lymphocytes Absolute: 1.6 10*3/uL (ref 0.7–3.1)
Lymphs: 16 %
MCH: 28.8 pg (ref 26.6–33.0)
MCHC: 34.3 g/dL (ref 31.5–35.7)
MCV: 84 fL (ref 79–97)
MONOS ABS: 0.9 10*3/uL (ref 0.1–0.9)
Monocytes: 8 %
NEUTROS ABS: 7.6 10*3/uL — AB (ref 1.4–7.0)
Neutrophils: 74 %
Platelets: 201 10*3/uL (ref 150–379)
RBC: 5.52 x10E6/uL (ref 4.14–5.80)
RDW: 13.5 % (ref 12.3–15.4)
WBC: 10.4 10*3/uL (ref 3.4–10.8)

## 2016-09-05 LAB — BASIC METABOLIC PANEL
BUN / CREAT RATIO: 13 (ref 10–24)
BUN: 14 mg/dL (ref 8–27)
CO2: 25 mmol/L (ref 20–29)
Calcium: 9.4 mg/dL (ref 8.6–10.2)
Chloride: 102 mmol/L (ref 96–106)
Creatinine, Ser: 1.08 mg/dL (ref 0.76–1.27)
GFR, EST AFRICAN AMERICAN: 83 mL/min/{1.73_m2} (ref >59)
GFR, EST NON AFRICAN AMERICAN: 72 mL/min/{1.73_m2} (ref >59)
Glucose: 95 mg/dL (ref 65–99)
Potassium: 4.6 mmol/L (ref 3.5–5.2)
SODIUM: 143 mmol/L (ref 134–144)

## 2016-09-07 ENCOUNTER — Ambulatory Visit (HOSPITAL_COMMUNITY)
Admission: RE | Admit: 2016-09-07 | Discharge: 2016-09-08 | Disposition: A | Discharge status: Home / Self Care | Payer: BLUE CROSS/BLUE SHIELD | Source: Ambulatory Visit | Attending: Cardiology | Admitting: Cardiology

## 2016-09-07 ENCOUNTER — Ambulatory Visit (HOSPITAL_COMMUNITY): Payer: BLUE CROSS/BLUE SHIELD | Admitting: Critical Care Medicine

## 2016-09-07 ENCOUNTER — Encounter (HOSPITAL_COMMUNITY): Payer: Self-pay | Admitting: Certified Registered Nurse Anesthetist

## 2016-09-07 ENCOUNTER — Encounter (HOSPITAL_COMMUNITY)
Admission: RE | Disposition: A | Discharge status: Home / Self Care | Payer: Self-pay | Source: Ambulatory Visit | Attending: Cardiology

## 2016-09-07 ENCOUNTER — Ambulatory Visit: Payer: BLUE CROSS/BLUE SHIELD | Admitting: Cardiovascular Disease

## 2016-09-07 DIAGNOSIS — I4891 Unspecified atrial fibrillation: Secondary | ICD-10-CM | POA: Diagnosis present

## 2016-09-07 DIAGNOSIS — E785 Hyperlipidemia, unspecified: Secondary | ICD-10-CM | POA: Diagnosis not present

## 2016-09-07 DIAGNOSIS — I483 Typical atrial flutter: Secondary | ICD-10-CM | POA: Diagnosis not present

## 2016-09-07 DIAGNOSIS — I481 Persistent atrial fibrillation: Secondary | ICD-10-CM | POA: Diagnosis not present

## 2016-09-07 DIAGNOSIS — K219 Gastro-esophageal reflux disease without esophagitis: Secondary | ICD-10-CM | POA: Insufficient documentation

## 2016-09-07 DIAGNOSIS — Z7901 Long term (current) use of anticoagulants: Secondary | ICD-10-CM | POA: Diagnosis not present

## 2016-09-07 DIAGNOSIS — I480 Paroxysmal atrial fibrillation: Secondary | ICD-10-CM | POA: Diagnosis present

## 2016-09-07 HISTORY — PX: ATRIAL FIBRILLATION ABLATION: EP1191

## 2016-09-07 LAB — POCT ACTIVATED CLOTTING TIME
ACTIVATED CLOTTING TIME: 323 s
ACTIVATED CLOTTING TIME: 335 s
ACTIVATED CLOTTING TIME: 125 s
Activated Clotting Time: 340 seconds
Activated Clotting Time: 334 seconds

## 2016-09-07 SURGERY — ATRIAL FIBRILLATION ABLATION
Anesthesia: General

## 2016-09-07 MED ORDER — SODIUM CHLORIDE 0.9 % IV SOLN
250.0000 mL | INTRAVENOUS | Status: DC | PRN
Start: 2016-09-07 — End: 2016-09-08

## 2016-09-07 MED ORDER — PRAVASTATIN SODIUM 40 MG PO TABS
40.0000 mg | ORAL_TABLET | Freq: Every day | ORAL | Status: DC
  Administered 2016-09-07: 40 mg via ORAL
  Filled 2016-09-07: qty 1

## 2016-09-07 MED ORDER — HEPARIN (PORCINE) IN NACL 2-0.9 UNIT/ML-% IJ SOLN
INTRAMUSCULAR | Status: AC
  Filled 2016-09-07: qty 500

## 2016-09-07 MED ORDER — ONDANSETRON HCL 4 MG/2ML IJ SOLN
INTRAMUSCULAR | Status: DC | PRN
  Administered 2016-09-07 (×2): 4 mg via INTRAVENOUS

## 2016-09-07 MED ORDER — DOBUTAMINE IN D5W 4-5 MG/ML-% IV SOLN
INTRAVENOUS | Status: DC | PRN
  Administered 2016-09-07: 20 ug/kg/min via INTRAVENOUS

## 2016-09-07 MED ORDER — LIDOCAINE HCL (PF) 1 % IJ SOLN
INTRAMUSCULAR | Status: DC | PRN
  Administered 2016-09-07: 45 mL

## 2016-09-07 MED ORDER — HEPARIN SODIUM (PORCINE) 1000 UNIT/ML IJ SOLN
INTRAMUSCULAR | Status: DC | PRN
  Administered 2016-09-07: 14000 [IU] via INTRAVENOUS
  Administered 2016-09-07 (×2): 2000 [IU] via INTRAVENOUS
  Administered 2016-09-07: 1000 [IU] via INTRAVENOUS

## 2016-09-07 MED ORDER — MIDAZOLAM HCL 5 MG/5ML IJ SOLN
INTRAMUSCULAR | Status: DC | PRN
  Administered 2016-09-07: 2 mg via INTRAVENOUS

## 2016-09-07 MED ORDER — PANTOPRAZOLE SODIUM 40 MG PO TBEC
80.0000 mg | DELAYED_RELEASE_TABLET | Freq: Every day | ORAL | Status: DC
  Administered 2016-09-07 – 2016-09-08 (×2): 80 mg via ORAL
  Filled 2016-09-07 (×3): qty 2

## 2016-09-07 MED ORDER — SODIUM CHLORIDE 0.9% FLUSH: 3.0000 mL | INTRAVENOUS | Status: DC | PRN

## 2016-09-07 MED ORDER — RIVAROXABAN 20 MG PO TABS
20.0000 mg | ORAL_TABLET | Freq: Every day | ORAL | Status: DC
  Administered 2016-09-07: 20 mg via ORAL
  Filled 2016-09-07: qty 1

## 2016-09-07 MED ORDER — ACETAMINOPHEN 325 MG PO TABS
650.0000 mg | ORAL_TABLET | ORAL | Status: DC | PRN
  Administered 2016-09-07: 650 mg via ORAL
  Filled 2016-09-07 (×2): qty 2

## 2016-09-07 MED ORDER — HEPARIN (PORCINE) IN NACL 2-0.9 UNIT/ML-% IJ SOLN
INTRAMUSCULAR | Status: AC | PRN
  Administered 2016-09-07: 500 mL

## 2016-09-07 MED ORDER — HEPARIN SODIUM (PORCINE) 1000 UNIT/ML IJ SOLN
INTRAMUSCULAR | Status: AC
  Filled 2016-09-07: qty 1

## 2016-09-07 MED ORDER — SODIUM CHLORIDE 0.9% FLUSH
3.0000 mL | Freq: Two times a day (BID) | INTRAVENOUS | Status: DC
  Administered 2016-09-07: 3 mL via INTRAVENOUS

## 2016-09-07 MED ORDER — BUPROPION HCL ER (XL) 150 MG PO TB24
150.0000 mg | ORAL_TABLET | Freq: Every day | ORAL | Status: DC
  Administered 2016-09-07 – 2016-09-08 (×2): 150 mg via ORAL
  Filled 2016-09-07 (×2): qty 1

## 2016-09-07 MED ORDER — ONDANSETRON HCL 4 MG/2ML IJ SOLN: 4.0000 mg | Freq: Four times a day (QID) | INTRAMUSCULAR | Status: DC | PRN

## 2016-09-07 MED ORDER — SODIUM CHLORIDE 0.9 % IV SOLN
INTRAVENOUS | Status: DC | PRN
  Administered 2016-09-07 (×2): via INTRAVENOUS

## 2016-09-07 MED ORDER — PROTAMINE SULFATE 10 MG/ML IV SOLN
INTRAVENOUS | Status: DC | PRN
  Administered 2016-09-07: 40 mg via INTRAVENOUS

## 2016-09-07 MED ORDER — DEXAMETHASONE SODIUM PHOSPHATE 10 MG/ML IJ SOLN
INTRAMUSCULAR | Status: DC | PRN
  Administered 2016-09-07: 4 mg via INTRAVENOUS

## 2016-09-07 MED ORDER — ROCURONIUM BROMIDE 100 MG/10ML IV SOLN
INTRAVENOUS | Status: DC | PRN
Start: 2016-09-07 — End: 2016-09-07
  Administered 2016-09-07: 40 mg via INTRAVENOUS
  Administered 2016-09-07: 15 mg via INTRAVENOUS
  Administered 2016-09-07: 50 mg via INTRAVENOUS
  Administered 2016-09-07: 15 mg via INTRAVENOUS

## 2016-09-07 MED ORDER — PHENYLEPHRINE HCL 10 MG/ML IJ SOLN
INTRAVENOUS | Status: DC | PRN
  Administered 2016-09-07: 20 ug/min via INTRAVENOUS

## 2016-09-07 MED ORDER — FENTANYL CITRATE (PF) 100 MCG/2ML IJ SOLN
INTRAMUSCULAR | Status: DC | PRN
  Administered 2016-09-07: 50 ug via INTRAVENOUS
  Administered 2016-09-07: 100 ug via INTRAVENOUS

## 2016-09-07 MED ORDER — HEPARIN SODIUM (PORCINE) 1000 UNIT/ML IJ SOLN
INTRAMUSCULAR | Status: DC | PRN
  Administered 2016-09-07: 1000 [IU] via INTRAVENOUS

## 2016-09-07 MED ORDER — ACETAMINOPHEN 325 MG PO TABS
ORAL_TABLET | ORAL | Status: AC
  Filled 2016-09-07: qty 2

## 2016-09-07 MED ORDER — PROPOFOL 10 MG/ML IV BOLUS
INTRAVENOUS | Status: DC | PRN
  Administered 2016-09-07: 170 mg via INTRAVENOUS

## 2016-09-07 MED ORDER — LIDOCAINE HCL (PF) 1 % IJ SOLN
INTRAMUSCULAR | Status: AC
  Filled 2016-09-07: qty 30

## 2016-09-07 MED ORDER — LIDOCAINE HCL (CARDIAC) 20 MG/ML IV SOLN
INTRAVENOUS | Status: DC | PRN
  Administered 2016-09-07: 60 mg via INTRAVENOUS

## 2016-09-07 MED ORDER — SUGAMMADEX SODIUM 500 MG/5ML IV SOLN
INTRAVENOUS | Status: DC | PRN
  Administered 2016-09-07: 200 mg via INTRAVENOUS

## 2016-09-07 SURGICAL SUPPLY — 19 items
BAG SNAP BAND KOVER 36X36 (MISCELLANEOUS) ×2 IMPLANT
BLANKET WARM UNDERBOD FULL ACC (MISCELLANEOUS) ×2 IMPLANT
CATH SMTCH THERMOCOOL SF DF (CATHETERS) ×1 IMPLANT
CATH SOUNDSTAR ECO REPROCESSED (CATHETERS) ×1 IMPLANT
CATH VARIABLE LASSO NAV 2515 (CATHETERS) ×1 IMPLANT
CATH WEBSTER BI DIR CS D-F CRV (CATHETERS) ×1 IMPLANT
COVER SWIFTLINK CONNECTOR (BAG) ×2 IMPLANT
NDL TRANSSEPTAL BRK 98CM (NEEDLE) IMPLANT
NEEDLE TRANSSEPTAL BRK 98CM (NEEDLE) ×2 IMPLANT
PACK EP LATEX FREE (CUSTOM PROCEDURE TRAY) ×2
PACK EP LF (CUSTOM PROCEDURE TRAY) ×1 IMPLANT
PAD DEFIB LIFELINK (PAD) ×2 IMPLANT
PATCH CARTO3 (PAD) ×1 IMPLANT
SHEATH AGILIS NXT 8.5F 71CM (SHEATH) ×2 IMPLANT
SHEATH AVANTI 11F 11CM (SHEATH) ×1 IMPLANT
SHEATH PINNACLE 7F 10CM (SHEATH) ×2 IMPLANT
SHEATH PINNACLE 8F 10CM (SHEATH) ×2 IMPLANT
SHEATH PINNACLE 9F 10CM (SHEATH) ×2 IMPLANT
TUBING SMART ABLATE COOLFLOW (TUBING) ×1 IMPLANT

## 2016-09-07 NOTE — Anesthesia Postprocedure Evaluation (Signed)
Anesthesia Post Note  Patient: [name] [name]  Procedure(s) Performed: Procedure(s) (LRB): Atrial Fibrillation Ablation (N/A)     Patient location during evaluation: PACU Anesthesia Type: General Level of consciousness: awake, awake and alert and oriented Pain management: pain level controlled Vital Signs Assessment: post-procedure vital signs reviewed and stable Respiratory status: spontaneous breathing, nonlabored ventilation and respiratory function stable Cardiovascular status: blood pressure returned to baseline Anesthetic complications: no    Last Vitals:  Vitals:   09/07/16 1156 09/07/16 1200  BP:  (!) 109/59  Pulse:  76  Resp:  20  Temp: (!) 35.9 C   SpO2:  95%    Last Pain:  Vitals:   09/07/16 1156  TempSrc: Temporal                 Derryl Uher COKER

## 2016-09-07 NOTE — Progress Notes (Signed)
Site area: rt groin fv sheaths x2 Site Prior to Removal:  Level 0 Pressure Applied For: 20 minutes Manual:   yes Patient Status During Pull:  stable Post Pull Site:  Level 0 Post Pull Instructions Given:  yes Post Pull Pulses Present: palpable Dressing Applied:  Gauze and tegaderm Bedrest begins @  Comments:

## 2016-09-07 NOTE — H&P (Signed)
[name] [name] is a 66 y.o. male with a history of atrial fibrillation. He presents today for ablation. On exam, iRRR, no murmurs, lungs clear. He has symptoms of fatigue and SOB. He has had multiple cardioversions without maintaining sinus rhythm. Risks and benefits discussed. Risks include but not limited to bleeding, tamponade, heart block, stroke, damage to surrounding organs. He understands the risks and has agreed to the procedure.  Indra Wolters Curt Bears, MD 09/07/2016 7:11 AM

## 2016-09-07 NOTE — Anesthesia Preprocedure Evaluation (Signed)
Anesthesia Evaluation  Patient identified by MRN, date of birth, ID band Patient awake    Reviewed: Allergy & Precautions, NPO status , Patient's Chart, lab work & pertinent test results  Airway Mallampati: III  TM Distance: >3 FB Neck ROM: Full    Dental  (+) Teeth Intact, Dental Advisory Given   Pulmonary    breath sounds clear to auscultation       Cardiovascular  Rhythm:Irregular Rate:Normal     Neuro/Psych    GI/Hepatic   Endo/Other    Renal/GU      Musculoskeletal   Abdominal (+) + obese,   Peds  Hematology   Anesthesia Other Findings   Reproductive/Obstetrics                             Anesthesia Physical Anesthesia Plan  ASA: III  Anesthesia Plan: General   Post-op Pain Management:    Induction: Intravenous  PONV Risk Score and Plan: Ondansetron and Dexamethasone  Airway Management Planned: Oral ETT  Additional Equipment:   Intra-op Plan:   Post-operative Plan: Extubation in OR  Informed Consent: I have reviewed the patients History and Physical, chart, labs and discussed the procedure including the risks, benefits and alternatives for the proposed anesthesia with the patient or authorized representative who has indicated his/her understanding and acceptance.   Dental advisory given  Plan Discussed with: Anesthesiologist and CRNA  Anesthesia Plan Comments:         Anesthesia Quick Evaluation

## 2016-09-07 NOTE — Transfer of Care (Signed)
Immediate Anesthesia Transfer of Care Note  Patient: [name] [name]  Procedure(s) Performed: Procedure(s): Atrial Fibrillation Ablation (N/A)  Patient Location: PACU  Anesthesia Type:General  Level of Consciousness: awake, alert  and oriented  Airway & Oxygen Therapy: Patient Spontanous Breathing and Patient connected to nasal cannula oxygen  Post-op Assessment: Report given to RN, Post -op Vital signs reviewed and stable and Patient moving all extremities X 4  Post vital signs: Reviewed and stable  Last Vitals:  Vitals:   09/07/16 1155 09/07/16 1156  BP: (!) 120/45   Pulse: 76   Resp: 20   Temp:  (!) 35.9 C  SpO2: 95%     Last Pain:  Vitals:   09/07/16 1156  TempSrc: Temporal         Complications: No apparent anesthesia complications

## 2016-09-07 NOTE — Anesthesia Procedure Notes (Signed)
Procedure Name: Intubation Date/Time: 09/07/2016 7:53 AM Performed by: Rejeana Brock L Pre-anesthesia Checklist: Patient identified, Emergency Drugs available, Suction available and Patient being monitored Patient Re-evaluated:Patient Re-evaluated prior to induction Oxygen Delivery Method: Circle System Utilized Preoxygenation: Pre-oxygenation with 100% oxygen Induction Type: IV induction Ventilation: Mask ventilation without difficulty and Oral airway inserted - appropriate to patient size Laryngoscope Size: Mac and 4 Grade View: Grade II Tube type: Oral Tube size: 7.5 mm Number of attempts: 1 Airway Equipment and Method: Stylet and Oral airway Placement Confirmation: ETT inserted through vocal cords under direct vision,  positive ETCO2 and breath sounds checked- equal and bilateral Secured at: 22 cm Tube secured with: Tape Dental Injury: Teeth and Oropharynx as per pre-operative assessment

## 2016-09-07 NOTE — Progress Notes (Signed)
Site area: left groin fv sheaths x2 Site Prior to Removal:  Level 0 Pressure Applied For: 20 minutes Manual:   yes Patient Status During Pull:  stable Post Pull Site:  Level 0 Post Pull Instructions Given:  yes Post Pull Pulses Present: palpable Dressing Applied:  Gauze and tegaderm Bedrest begins @ 1250 Comments: IV saline locked

## 2016-09-08 DIAGNOSIS — K219 Gastro-esophageal reflux disease without esophagitis: Secondary | ICD-10-CM | POA: Diagnosis not present

## 2016-09-08 DIAGNOSIS — I481 Persistent atrial fibrillation: Secondary | ICD-10-CM

## 2016-09-08 DIAGNOSIS — E785 Hyperlipidemia, unspecified: Secondary | ICD-10-CM | POA: Diagnosis not present

## 2016-09-08 DIAGNOSIS — I483 Typical atrial flutter: Secondary | ICD-10-CM | POA: Diagnosis not present

## 2016-09-08 NOTE — Discharge Summary (Signed)
ELECTROPHYSIOLOGY PROCEDURE DISCHARGE SUMMARY    Patient ID: [name] [name],  MRN: [id number], DOB/AGE: August 22, 1950 66 y.o.  Admit date: 09/07/2016 Discharge date: 09/08/2016  Primary Care Physician: Mayra Neer, MD Primary Cardiologist: [facility] Electrophysiologist: [facility]  Primary Discharge Diagnosis:  Persistent atrial fibrillation and atrial flutter status post ablation this admission  Secondary Discharge Diagnosis:  1.  GERD 2.  Hyperlipidemia  Procedures This Admission:  1.  Electrophysiology study and radiofrequency catheter ablation on 09/07/16 by Dr Curt Bears.  This study demonstrated atrial fibrillation upon presentation; successful electrical isolation and anatomical encircling of all four pulmonary veins with radiofrequency current with ablation along the left atrial roof; cavo-tricuspid isthmus ablation was performed with complete bidirectional isthmus block achieved; no inducible arrhythmias following ablation both on and off of dobutamine; cardioversion; no early apparent complications.    Brief HPI: [name] [name] is a 66 y.o. male with a history of persistent atrial fibrillation.  Risks, benefits, and alternatives to catheter ablation of atrial fibrillation were reviewed with the patient who wished to proceed.  The patient underwent cardiac CT prior to the procedure which demonstrated no LAA thrombus.    Hospital Course:  The patient was admitted and underwent EPS/RFCA of atrial fibrillation with details as outlined above.  They were monitored on telemetry overnight which demonstrated SR.  Groin was without complication on the day of discharge.  The patient was examined and considered to be stable for discharge.  Wound care and restrictions were reviewed with the patient.  The patient will be seen back by Roderic Palau, NP in 4 weeks and Dr Curt Bears in 12 weeks for post ablation follow up.   This patients CHA2DS2-VASc Score and unadjusted Ischemic Stroke Rate (% per  year) is equal to 0.2 % stroke rate/year from a score of 0 Above score calculated as 1 point each if present [CHF, HTN, DM, Vascular=MI/PAD/Aortic Plaque, Age if 65-74, or Male] Above score calculated as 2 points each if present [Age > 75, or Stroke/TIA/TE]   Physical Exam: Vitals:   09/07/16 1726 09/07/16 2017 09/08/16 0500 09/08/16 0749  BP: 125/86 116/62 122/75 106/69  Pulse:  78 68 72  Resp:  18 18 18   Temp:  98.3 F (36.8 C) 97.9 F (36.6 C) 97.9 F (36.6 C)  TempSrc:  Oral Oral Oral  SpO2:  95% 99% 99%  Weight:   246 lb 12.8 oz (111.9 kg)   Height:        GEN- The patient is well appearing, alert and oriented x 3 today.   HEENT: normocephalic, atraumatic; sclera clear, conjunctiva pink; hearing intact; oropharynx clear; neck supple  Lungs- Clear to ausculation bilaterally, normal work of breathing.  No wheezes, rales, rhonchi Heart- Regular rate and rhythm, no murmurs, rubs or gallops  GI- soft, non-tender, non-distended, bowel sounds present  Extremities- no clubbing, cyanosis, or edema; DP/PT/radial pulses 2+ bilaterally, groin without hematoma/bruit MS- no significant deformity or atrophy Skin- warm and dry, no rash or lesion Psych- euthymic mood, full affect Neuro- strength and sensation are intact   Labs:   Lab Results  Component Value Date   WBC 10.4 09/04/2016   HGB 15.9 09/04/2016   HCT 46.4 09/04/2016   MCV 84 09/04/2016   PLT 201 09/04/2016     Recent Labs Lab 09/04/16 1457  NA 143  K 4.6  CL 102  CO2 25  BUN 14  CREATININE 1.08  CALCIUM 9.4  GLUCOSE 95     Discharge Medications:  Allergies  as of 09/08/2016   No Known Allergies     Medication List    TAKE these medications   buPROPion 150 MG 24 hr tablet Commonly known as:  WELLBUTRIN XL Take 150 mg by mouth daily.   LIVALO 2 MG Tabs Generic drug:  Pitavastatin Calcium Take 2 mg by mouth daily.   omeprazole 20 MG capsule Commonly known as:  PRILOSEC Take 20 mg by mouth  daily.   rivaroxaban 20 MG Tabs tablet Commonly known as:  XARELTO Take 1 tablet (20 mg total) by mouth daily with supper. What changed:  when to take this            Discharge Care Instructions        Start     Ordered   09/08/16 0000  Increase activity slowly     09/08/16 1425   09/08/16 0000  Diet - low sodium heart healthy     09/08/16 1425      Disposition:  Discharge Instructions    Diet - low sodium heart healthy    Complete by:  As directed    Increase activity slowly    Complete by:  As directed      Follow-up Information    [facility] Follow up on 10/05/2016.   Specialty:  Cardiology Why:  at 8:30AM Contact information: [id number] [place] [address] [phone]       Constance Haw, MD Follow up on 12/10/2016.   Specialty:  Cardiology Why:  at 9:30AM Contact information: [place] [address] [phone]           Duration of Discharge Encounter: Greater than 30 minutes including physician time.  Jonetta Speak, PA-C 09/08/2016 2:25 PM Beeper [phone]  EP Attending  Patient seen and examined. Agree with above. He is stable for DC home. Groin with no hematoma.  Mikle Bosworth.D.

## 2016-09-08 NOTE — Discharge Instructions (Signed)
No driving for 4 days. No lifting over 5 lbs for 1 week. No sexual activity for 1 week. You may return to work in 1 week. Keep procedure site clean & dry. If you notice increased pain, swelling, bleeding or pus, call/return!  You may shower, but no soaking baths/hot tubs/pools for 1 week.  ° ° °You have an appointment set up with the At[facility] Fibrillation Clinic.  Multiple studies have shown that being followed by a dedicated atrial fibrillation clinic in addition to the standard care you receive from your other physicians improves health. We believe that enrollment in the atrial fibrillation clinic will allow us to better care for you.  ° °The phone number to the Atrial Fibrillation Clinic is [phone]. The clinic is staffed Monday through Friday from 8:30am to 5pm. ° °Parking Directions: The clinic is located in the Heart and Vascular Building connected to [facility]. °1)From [address] turn on to [address] and go to the 3rd entrance  (Heart and Vascular entrance) on the right. °2)Look to the right for Heart &Vascular Parking Garage. °3)A code for the entrance is required please call the clinic to receive this.   °4)Take the elevators to the 1st floor. Registration is in the room with the glass walls at the end of the hallway. ° °If you have any trouble parking or locating the clinic, please don’t hesitate to call [phone]. ° ° °

## 2016-09-08 NOTE — Progress Notes (Signed)
   Progress Note  Patient Name: [name] [name] Date of Encounter: 09/08/2016  Primary Cardiologist: Dr. Curt Bears  Subjective   Denies chest pain or shortness of breath  Inpatient Medications    Scheduled Meds: . buPROPion  150 mg Oral Daily  . pantoprazole  80 mg Oral Daily  . pravastatin  40 mg Oral q1800  . rivaroxaban  20 mg Oral Q supper  . sodium chloride flush  3 mL Intravenous Q12H   Continuous Infusions: . sodium chloride     PRN Meds: sodium chloride, acetaminophen, ondansetron (ZOFRAN) IV, sodium chloride flush   Vital Signs    Vitals:   09/07/16 1726 09/07/16 2017 09/08/16 0500 09/08/16 0749  BP: 125/86 116/62 122/75 106/69  Pulse:  78 68 72  Resp:  18 18 18   Temp:  98.3 F (36.8 C) 97.9 F (36.6 C) 97.9 F (36.6 C)  TempSrc:  Oral Oral Oral  SpO2:  95% 99% 99%  Weight:   246 lb 12.8 oz (111.9 kg)   Height:        Intake/Output Summary (Last 24 hours) at 09/08/16 1153 Last data filed at 09/08/16 0500  Gross per 24 hour  Intake              723 ml  Output              900 ml  Net             -177 ml   Filed Weights   09/07/16 0544 09/08/16 0500  Weight: 248 lb (112.5 kg) 246 lb 12.8 oz (111.9 kg)    Telemetry    Normal sinus rhythm - Personally Reviewed  ECG    Normal sinus rhythm - Personally Reviewed  Physical Exam   GEN: No acute distress.   Neck: No JVD Cardiac: RRR, no murmurs, rubs, or gallops.  Respiratory: Clear to auscultation bilaterally. GI: Soft, nontender, non-distended  MS: No edema; No deformity.Groin with no hematomas. Neuro:  Nonfocal  Psych: Normal affect   Labs    Chemistry Recent Labs Lab 09/04/16 1457  NA 143  K 4.6  CL 102  CO2 25  GLUCOSE 95  BUN 14  CREATININE 1.08  CALCIUM 9.4  GFRNONAA 72  GFRAA 83     Hematology Recent Labs Lab 09/04/16 1457  WBC 10.4  RBC 5.52  HGB 15.9  HCT 46.4  MCV 84  MCH 28.8  MCHC 34.3  RDW 13.5  PLT 201    Cardiac EnzymesNo results for input(s):  TROPONINI in the last 168 hours. No results for input(s): TROPIPOC in the last 168 hours.   BNPNo results for input(s): BNP, PROBNP in the last 168 hours.   DDimer No results for input(s): DDIMER in the last 168 hours.   Radiology    No results found.  Cardiac Studies   None  Patient Profile     66 y.o. male admitted for A. fib ablation which was carried out successfully.  Assessment & Plan    1. Persistent atrial fibrillation - he is status post successful catheter ablation of atrial fibrillation with pulmonary vein isolation. He is stable for discharge. Usual follow-up. Continue current medical therapy.  Cristopher Peru, M.D.  Signed, Cristopher Peru, MD  09/08/2016, 11:53 AM  Patient ID: [name] [name], male   DOB: 1950-12-04, 66 y.o.   MRN: [id number]

## 2016-09-08 NOTE — Progress Notes (Signed)
Patient ambulated in hallway with RN about 600 feet; tolerated well.  Patient did state he had "a tiny bit" of shortness of breath when RN inquired.  Patient able to carry on a conversation with RN during ambulation, no difficulty noted per RN.  Bilateral groin sites assessed post ambulation per RN, no changes noted.

## 2016-09-10 ENCOUNTER — Encounter (HOSPITAL_COMMUNITY): Payer: Self-pay | Admitting: Cardiology

## 2016-09-10 ENCOUNTER — Telehealth: Payer: Self-pay | Admitting: Cardiology

## 2016-09-10 NOTE — Telephone Encounter (Signed)
Informed wife that it is not uncommon to experience "breakthrough" AFib up to 3 months post ablation. Discussed/reviewed how to palpate radial pulse to tell whether HR is regular/irregular.  She will continue to monitor pt at home. She thanks me for calling and helping with this.  She feels much better knowing this is not "abnormal".

## 2016-09-10 NOTE — Telephone Encounter (Signed)
New message    Pt wife is calling and states Dr. Alroy Dust with [facility] says he is in AFIB again and was told to let us know. Pt wife wants to know what to do.

## 2016-09-26 ENCOUNTER — Inpatient Hospital Stay (HOSPITAL_COMMUNITY)
Admission: RE | Admit: 2016-09-26 | Payer: BLUE CROSS/BLUE SHIELD | Source: Ambulatory Visit | Admitting: Nurse Practitioner

## 2016-09-27 ENCOUNTER — Encounter (HOSPITAL_COMMUNITY): Payer: Self-pay | Admitting: Nurse Practitioner

## 2016-09-27 ENCOUNTER — Ambulatory Visit (HOSPITAL_COMMUNITY)
Admission: RE | Admit: 2016-09-27 | Discharge: 2016-09-27 | Disposition: A | Discharge status: Home / Self Care | Payer: BLUE CROSS/BLUE SHIELD | Source: Ambulatory Visit | Attending: Nurse Practitioner | Admitting: Nurse Practitioner

## 2016-09-27 ENCOUNTER — Other Ambulatory Visit (HOSPITAL_COMMUNITY): Payer: Self-pay | Admitting: *Deleted

## 2016-09-27 VITALS — BP 114/68 | HR 116 | Ht 68.0 in | Wt 245.8 lb

## 2016-09-27 DIAGNOSIS — I4891 Unspecified atrial fibrillation: Secondary | ICD-10-CM | POA: Diagnosis present

## 2016-09-27 DIAGNOSIS — E785 Hyperlipidemia, unspecified: Secondary | ICD-10-CM | POA: Insufficient documentation

## 2016-09-27 DIAGNOSIS — K219 Gastro-esophageal reflux disease without esophagitis: Secondary | ICD-10-CM | POA: Diagnosis not present

## 2016-09-27 DIAGNOSIS — I4819 Other persistent atrial fibrillation: Secondary | ICD-10-CM

## 2016-09-27 DIAGNOSIS — I481 Persistent atrial fibrillation: Secondary | ICD-10-CM | POA: Diagnosis not present

## 2016-09-27 DIAGNOSIS — Z7901 Long term (current) use of anticoagulants: Secondary | ICD-10-CM | POA: Insufficient documentation

## 2016-09-27 LAB — CBC
HCT: 46 % (ref 39.0–52.0)
Hemoglobin: 15.7 g/dL (ref 13.0–17.0)
MCH: 29.1 pg (ref 26.0–34.0)
MCHC: 34.1 g/dL (ref 30.0–36.0)
MCV: 85.2 fL (ref 78.0–100.0)
PLATELETS: 187 10*3/uL (ref 150–400)
RBC: 5.4 MIL/uL (ref 4.22–5.81)
RDW: 12.8 % (ref 11.5–15.5)
WBC: 8.7 10*3/uL (ref 4.0–10.5)

## 2016-09-27 LAB — BASIC METABOLIC PANEL
ANION GAP: 6 (ref 5–15)
BUN: 10 mg/dL (ref 6–20)
CALCIUM: 9.3 mg/dL (ref 8.9–10.3)
CO2: 27 mmol/L (ref 22–32)
CREATININE: 1.08 mg/dL (ref 0.61–1.24)
Chloride: 103 mmol/L (ref 101–111)
GFR calc Af Amer: >60 mL/min (ref >60)
GLUCOSE: 111 mg/dL — AB (ref 65–99)
Potassium: 4.6 mmol/L (ref 3.5–5.1)
Sodium: 136 mmol/L (ref 135–145)

## 2016-09-27 MED ORDER — METOPROLOL TARTRATE 25 MG PO TABS: 12.5000 mg | ORAL_TABLET | Freq: Two times a day (BID) | ORAL | 3 refills | Status: DC

## 2016-09-27 NOTE — Patient Instructions (Addendum)
Your physician has recommended you make the following change in your medication:  1)Start Metoprolol 1/2 tablet twice a day (12.5mg )  Monday for EKG   Cardioversion scheduled for Friday, September 21st  - Arrive at the [facility] and go to admitting at 9:30AM  -Do not eat or drink anything after midnight the night prior to your procedure.  - Take all your medication with a sip of water prior to arrival.  - You will not be able to drive home after your procedure.

## 2016-09-27 NOTE — Progress Notes (Signed)
Primary Care Physician: Mayra Neer, MD Referring Physician:Dr. Raynald Blend [name] [name] is a 66 y.o. male with a h/o persistent afib that underwent ablation, 09/07/16. He is in the afib clinic for f/u. EKG shows afib at 116 bpm. He is fairly asymptomatic. He thinks he may have been in afib since ablation but does not monitor it at home. He saw his PCP last week and was in afib at that time. He is on xarelto without any missed doses for at least 3 weeks. No swallowing or groin issues since ablation.  Today, he denies symptoms of palpitations, chest pain, shortness of breath, orthopnea, PND, lower extremity edema, dizziness, presyncope, syncope, or neurologic sequela. The patient is tolerating medications without difficulties and is otherwise without complaint today.   Past Medical History:  Diagnosis Date  . Cancer ([place])   . GERD (gastroesophageal reflux disease)   . Hyperlipidemia    Past Surgical History:  Procedure Laterality Date  . ATRIAL FIBRILLATION ABLATION N/A 09/07/2016   Procedure: Atrial Fibrillation Ablation;  Surgeon: Constance Haw, MD;  Location: [facility];  Service: Cardiovascular;  Laterality: N/A;  . CARDIOVERSION N/A 07/23/2016   Procedure: CARDIOVERSION;  Surgeon: Sanda Klein, MD;  Location: [facility];  Service: Cardiovascular;  Laterality: N/A;    Current Outpatient Prescriptions  Medication Sig Dispense Refill  . buPROPion (WELLBUTRIN XL) 150 MG 24 hr tablet Take 300 mg by mouth daily.     [name] omeprazole (PRILOSEC) 20 MG capsule Take 20 mg by mouth daily.    . Pitavastatin Calcium (LIVALO) 2 MG TABS Take 2 mg by mouth daily.     . rivaroxaban (XARELTO) 20 MG TABS tablet Take 1 tablet (20 mg total) by mouth daily with supper. (Patient taking differently: Take 20 mg by mouth daily after breakfast. ) 90 tablet 3  . metoprolol tartrate (LOPRESSOR) 25 MG tablet Take 0.5 tablets (12.5 mg total) by mouth 2 (two) times daily. 30 tablet 3   No  current facility-administered medications for this encounter.     No Known Allergies  Social History   Social History  . Marital status: Married    Spouse name: N/A  . Number of children: N/A  . Years of education: N/A   Occupational History  . Not on file.   Social History Main Topics  . Smoking status: Never Smoker  . Smokeless tobacco: Never Used  . Alcohol use Yes     Comment: occasionally  . Drug use: No  . Sexual activity: Not on file   Other Topics Concern  . Not on file   Social History Narrative  . No narrative on file    Family History  Problem Relation Age of Onset  . Heart Problems Father   . Alzheimer's disease Sister   . Stroke Brother   . Heart Problems Brother   . Heart Problems Brother     ROS- All systems are reviewed and negative except as per the HPI above  Physical Exam: Vitals:   09/27/16 0938  BP: 114/68  Pulse: (!) 116  Weight: 245 lb 12.8 oz (111.5 kg)  Height: 5\' 8"  (1.727 m)   Wt Readings from Last 3 Encounters:  09/27/16 245 lb 12.8 oz (111.5 kg)  09/08/16 246 lb 12.8 oz (111.9 kg)  09/04/16 243 lb 9.6 oz (110.5 kg)    Labs: Lab Results  Component Value Date   NA 136 09/27/2016   K 4.6 09/27/2016   CL 103 09/27/2016  CO2 27 09/27/2016   GLUCOSE 111 (H) 09/27/2016   BUN 10 09/27/2016   CREATININE 1.08 09/27/2016   CALCIUM 9.3 09/27/2016   MG 2.0 07/23/2016   Lab Results  Component Value Date   INR 1.1 07/10/2016   No results found for: CHOL, HDL, LDLCALC, TRIG   GEN- The patient is well appearing, alert and oriented x 3 today.   Head- normocephalic, atraumatic Eyes-  Sclera clear, conjunctiva pink Ears- hearing intact Oropharynx- clear Neck- supple, no JVP Lymph- no cervical lymphadenopathy Lungs- Clear to ausculation bilaterally, normal work of breathing Heart- Rapid irregular rate and rhythm, no murmurs, rubs or gallops, PMI not laterally displaced GI- soft, NT, ND, + BS Extremities- no clubbing,  cyanosis, or edema MS- no significant deformity or atrophy Skin- no rash or lesion Psych- euthymic mood, full affect Neuro- strength and sensation are intact  EKG-afib at 116 bpm, qrs int 76 ms, qtc 480 ms Epic records reviewed    Assessment and Plan: 1. Persistent afib Pt feels that he has been in afib for many weeks, fairly asymptomatic Will add metoprolol tartrate 12.5 mg bid F/u Monday for EKG/BP  Will go ahead and schedule  cardioversion for Friday 9/21 No missed doses of xarelto Bmet/cbc today  [name] [name], [facility] [address] [place], [place] [address] [phone]

## 2016-10-01 ENCOUNTER — Ambulatory Visit (HOSPITAL_COMMUNITY)
Admission: RE | Admit: 2016-10-01 | Discharge: 2016-10-01 | Disposition: A | Discharge status: Home / Self Care | Payer: BLUE CROSS/BLUE SHIELD | Source: Ambulatory Visit | Attending: Nurse Practitioner | Admitting: Nurse Practitioner

## 2016-10-01 ENCOUNTER — Other Ambulatory Visit: Payer: Self-pay

## 2016-10-01 DIAGNOSIS — I4891 Unspecified atrial fibrillation: Secondary | ICD-10-CM | POA: Diagnosis present

## 2016-10-01 DIAGNOSIS — R9431 Abnormal electrocardiogram [ECG] [EKG]: Secondary | ICD-10-CM | POA: Diagnosis not present

## 2016-10-01 DIAGNOSIS — Z7902 Long term (current) use of antithrombotics/antiplatelets: Secondary | ICD-10-CM | POA: Diagnosis not present

## 2016-10-01 NOTE — Patient Instructions (Signed)
Cardioversion scheduled for Wednesday, September 19th  - Arrive at the [facility] and go to admitting at 10:30AM  -Do not eat or drink anything after midnight the night prior to your procedure.  - Take all your medication with a sip of water prior to arrival.  - You will not be able to drive home after your procedure.

## 2016-10-01 NOTE — Progress Notes (Addendum)
Pt in for repeat EKG, to be reviewed by Roderic Palau, NP  Pt s/p ablation 4-5 weeks ago but but with afib for several weeks. Started metoprolol 12.5 mg bid and is rate controlled at 92 bpm, BP 112/78. Cardioversion scheduled for Friday but able to move up to Wednesday. No  missed does of xarelto since ablation.

## 2016-10-03 ENCOUNTER — Ambulatory Visit (HOSPITAL_COMMUNITY): Payer: BLUE CROSS/BLUE SHIELD | Admitting: Anesthesiology

## 2016-10-03 ENCOUNTER — Ambulatory Visit (HOSPITAL_COMMUNITY)
Admission: RE | Admit: 2016-10-03 | Discharge: 2016-10-03 | Disposition: A | Discharge status: Home / Self Care | Payer: BLUE CROSS/BLUE SHIELD | Source: Ambulatory Visit | Attending: Cardiology | Admitting: Cardiology

## 2016-10-03 ENCOUNTER — Encounter (HOSPITAL_COMMUNITY)
Admission: RE | Disposition: A | Discharge status: Home / Self Care | Payer: Self-pay | Source: Ambulatory Visit | Attending: Cardiology

## 2016-10-03 ENCOUNTER — Encounter (HOSPITAL_COMMUNITY): Payer: Self-pay | Admitting: *Deleted

## 2016-10-03 DIAGNOSIS — Z79899 Other long term (current) drug therapy: Secondary | ICD-10-CM | POA: Insufficient documentation

## 2016-10-03 DIAGNOSIS — K219 Gastro-esophageal reflux disease without esophagitis: Secondary | ICD-10-CM | POA: Diagnosis not present

## 2016-10-03 DIAGNOSIS — I481 Persistent atrial fibrillation: Secondary | ICD-10-CM

## 2016-10-03 DIAGNOSIS — I4891 Unspecified atrial fibrillation: Secondary | ICD-10-CM | POA: Diagnosis present

## 2016-10-03 DIAGNOSIS — Z7901 Long term (current) use of anticoagulants: Secondary | ICD-10-CM | POA: Insufficient documentation

## 2016-10-03 HISTORY — PX: CARDIOVERSION: SHX1299

## 2016-10-03 SURGERY — CARDIOVERSION
Anesthesia: Monitor Anesthesia Care

## 2016-10-03 MED ORDER — LIDOCAINE HCL (CARDIAC) 20 MG/ML IV SOLN
INTRAVENOUS | Status: DC | PRN
  Administered 2016-10-03: 40 mg via INTRATRACHEAL

## 2016-10-03 MED ORDER — PROPOFOL 10 MG/ML IV BOLUS
INTRAVENOUS | Status: DC | PRN
  Administered 2016-10-03: 90 mg via INTRAVENOUS

## 2016-10-03 MED ORDER — SODIUM CHLORIDE 0.9 % IV SOLN
INTRAVENOUS | Status: AC | PRN
  Administered 2016-10-03: 500 mL via INTRAVENOUS

## 2016-10-03 NOTE — Anesthesia Preprocedure Evaluation (Signed)
Anesthesia Evaluation  Patient identified by MRN, date of birth, ID band Patient awake    Reviewed: Allergy & Precautions, NPO status , Patient's Chart, lab work & pertinent test results  Airway Mallampati: II       Dental  (+) Teeth Intact, Dental Advisory Given   Pulmonary    breath sounds clear to auscultation       Cardiovascular  Rhythm:Irregular Rate:Normal     Neuro/Psych    GI/Hepatic   Endo/Other    Renal/GU      Musculoskeletal   Abdominal   Peds  Hematology   Anesthesia Other Findings   Reproductive/Obstetrics                             Anesthesia Physical Anesthesia Plan  ASA: III  Anesthesia Plan: General   Post-op Pain Management:    Induction: Intravenous  PONV Risk Score and Plan:   Airway Management Planned: Mask  Additional Equipment:   Intra-op Plan:   Post-operative Plan:   Informed Consent: I have reviewed the patients History and Physical, chart, labs and discussed the procedure including the risks, benefits and alternatives for the proposed anesthesia with the patient or authorized representative who has indicated his/her understanding and acceptance.     Plan Discussed with: CRNA and Anesthesiologist  Anesthesia Plan Comments:         Anesthesia Quick Evaluation

## 2016-10-03 NOTE — CV Procedure (Signed)
[place]: On Rx Xarelto no missed doses Anesthesia Joslin / propofol  DCC x 4 120 J then 3x 200J Converted from afib rate 85 to SR rate 58  No immediate neurologic sequelae  Jenkins Rouge

## 2016-10-03 NOTE — Anesthesia Procedure Notes (Signed)
Procedure Name: MAC Date/Time: 10/03/2016 10:50 AM Performed by: Izora Gala Pre-anesthesia Checklist: Patient identified, Emergency Drugs available, Suction available and Patient being monitored Patient Re-evaluated:Patient Re-evaluated prior to induction Oxygen Delivery Method: Ambu bag Preoxygenation: Pre-oxygenation with 100% oxygen Induction Type: IV induction Ventilation: Mask ventilation without difficulty Placement Confirmation: positive ETCO2

## 2016-10-03 NOTE — Discharge Instructions (Signed)
Electrical Cardioversion, Care After °This sheet gives you information about how to care for yourself after your procedure. Your health care provider may also give you more specific instructions. If you have problems or questions, contact your health care provider. °What can I expect after the procedure? °After the procedure, it is common to have: °· Some redness on the skin where the shocks were given. ° °Follow these instructions at home: °· Do not drive for 24 hours if you were given a medicine to help you relax (sedative). °· Take over-the-counter and prescription medicines only as told by your health care provider. °· Ask your health care provider how to check your pulse. Check it often. °· Rest for 48 hours after the procedure or as told by your health care provider. °· Avoid or limit your caffeine use as told by your health care provider. °Contact a health care provider if: °· You feel like your heart is beating too quickly or your pulse is not regular. °· You have a serious muscle cramp that does not go away. °Get help right away if: °· You have discomfort in your chest. °· You are dizzy or you feel faint. °· You have trouble breathing or you are short of breath. °· Your speech is slurred. °· You have trouble moving an arm or leg on one side of your body. °· Your fingers or toes turn cold or blue. °This information is not intended to replace advice given to you by your health care provider. Make sure you discuss any questions you have with your health care provider. °Document Released: 10/22/2012 Document Revised: 08/05/2015 Document Reviewed: 07/08/2015 °Elsevier Interactive Patient Education © 2018 Elsevier Inc. ° °

## 2016-10-03 NOTE — Transfer of Care (Signed)
Immediate Anesthesia Transfer of Care Note  Patient: [name] [name]  Procedure(s) Performed: Procedure(s): CARDIOVERSION (N/A)  Patient Location: PACU  Anesthesia Type:General  Level of Consciousness: awake, alert  and oriented  Airway & Oxygen Therapy: Patient Spontanous Breathing and Patient connected to nasal cannula oxygen  Post-op Assessment: Report given to RN and Post -op Vital signs reviewed and stable  Post vital signs: Reviewed and stable  Last Vitals:  Vitals:   10/03/16 1026  BP: 136/83  Resp: (!) 24  Temp: 36.6 C  SpO2: 96%    Last Pain:  Vitals:   10/03/16 1026  TempSrc: Oral  PainSc: 4       Patients Stated Pain Goal: 3 (62/70/35 0093)  Complications: No apparent anesthesia complications

## 2016-10-03 NOTE — H&P (View-Only) (Signed)
Pt in for repeat EKG, to be reviewed by Roderic Palau, NP  Pt s/p ablation 4-5 weeks ago but but with afib for several weeks. Started metoprolol 12.5 mg bid and is rate controlled at 92 bpm, BP 112/78. Cardioversion scheduled for Friday but able to move up to Wednesday. No  missed does of xarelto since ablation.

## 2016-10-03 NOTE — Interval H&P Note (Signed)
History and Physical Interval Note:  10/03/2016 10:15 AM  [name] [name]  has presented today for surgery, with the diagnosis of AFIB  The various methods of treatment have been discussed with the patient and family. After consideration of risks, benefits and other options for treatment, the patient has consented to  Procedure(s): CARDIOVERSION (N/A) as a surgical intervention .  The patient's history has been reviewed, patient examined, no change in status, stable for surgery.  I have reviewed the patient's chart and labs.  Questions were answered to the patient's satisfaction.     Jenkins Rouge

## 2016-10-03 NOTE — Anesthesia Postprocedure Evaluation (Signed)
Anesthesia Post Note  Patient: [name] [name]  Procedure(s) Performed: Procedure(s) (LRB): CARDIOVERSION (N/A)     Patient location during evaluation: Endoscopy Anesthesia Type: General Level of consciousness: awake, awake and alert and oriented Pain management: pain level controlled Vital Signs Assessment: post-procedure vital signs reviewed and stable Respiratory status: spontaneous breathing, nonlabored ventilation and respiratory function stable Cardiovascular status: blood pressure returned to baseline Anesthetic complications: no    Last Vitals:  Vitals:   10/03/16 1120 10/03/16 1121  BP:    Pulse: (!) 58 (!) 58  Resp: 20   Temp:    SpO2: 98% 98%    Last Pain:  Vitals:   10/03/16 1105  TempSrc: Oral  PainSc: 0-No pain                 Elecia Serafin COKER

## 2016-10-04 ENCOUNTER — Encounter (HOSPITAL_COMMUNITY): Payer: Self-pay | Admitting: Cardiovascular Disease

## 2016-10-05 ENCOUNTER — Inpatient Hospital Stay (HOSPITAL_COMMUNITY)
Admission: RE | Admit: 2016-10-05 | Payer: BLUE CROSS/BLUE SHIELD | Source: Ambulatory Visit | Admitting: Nurse Practitioner

## 2016-10-12 ENCOUNTER — Ambulatory Visit (HOSPITAL_COMMUNITY)
Admission: RE | Admit: 2016-10-12 | Discharge: 2016-10-12 | Disposition: A | Discharge status: Home / Self Care | Payer: BLUE CROSS/BLUE SHIELD | Source: Ambulatory Visit | Attending: Nurse Practitioner | Admitting: Nurse Practitioner

## 2016-10-12 ENCOUNTER — Encounter (HOSPITAL_COMMUNITY): Payer: Self-pay | Admitting: Nurse Practitioner

## 2016-10-12 VITALS — BP 132/86 | HR 61 | Ht 68.0 in | Wt 249.2 lb

## 2016-10-12 DIAGNOSIS — Z7901 Long term (current) use of anticoagulants: Secondary | ICD-10-CM | POA: Insufficient documentation

## 2016-10-12 DIAGNOSIS — K219 Gastro-esophageal reflux disease without esophagitis: Secondary | ICD-10-CM | POA: Diagnosis not present

## 2016-10-12 DIAGNOSIS — Z9889 Other specified postprocedural states: Secondary | ICD-10-CM | POA: Insufficient documentation

## 2016-10-12 DIAGNOSIS — I481 Persistent atrial fibrillation: Secondary | ICD-10-CM | POA: Diagnosis present

## 2016-10-12 DIAGNOSIS — I4819 Other persistent atrial fibrillation: Secondary | ICD-10-CM

## 2016-10-12 DIAGNOSIS — E785 Hyperlipidemia, unspecified: Secondary | ICD-10-CM | POA: Diagnosis not present

## 2016-10-12 NOTE — Progress Notes (Signed)
Primary Care Physician: Mayra Neer, MD Referring Physician:Dr. Raynald Blend [name] [name] is a 66 y.o. male with a h/o persistent afib that underwent ablation, 09/07/16. He presnted to the afib  clinic for f/u, 1 month following ablation.. EKG showed afib at 116 bpm. He was fairly asymptomatic. He thinks he may have been in afib since ablation but does not monitor it at home. He saw his PCP last week and was in afib at that time. He is on xarelto without any missed doses for at least 3 weeks. No swallowing or groin issues since ablation. He was set up for cardioversion.  F/u cardioversion 9/27, he had successful cardioversion and continues in SR. He feels improved.  Today, he denies symptoms of palpitations, chest pain, shortness of breath, orthopnea, PND, lower extremity edema, dizziness, presyncope, syncope, or neurologic sequela. The patient is tolerating medications without difficulties and is otherwise without complaint today.   Past Medical History:  Diagnosis Date  . Cancer ([place])   . GERD (gastroesophageal reflux disease)   . Hyperlipidemia    Past Surgical History:  Procedure Laterality Date  . ATRIAL FIBRILLATION ABLATION N/A 09/07/2016   Procedure: Atrial Fibrillation Ablation;  Surgeon: Constance Haw, MD;  Location: [place] [facility];  Service: Cardiovascular;  Laterality: N/A;  . CARDIOVERSION N/A 07/23/2016   Procedure: CARDIOVERSION;  Surgeon: Sanda Klein, MD;  Location: [facility];  Service: Cardiovascular;  Laterality: N/A;  . CARDIOVERSION N/A 10/03/2016   Procedure: CARDIOVERSION;  Surgeon: Josue Hector, MD;  Location: [facility];  Service: Cardiovascular;  Laterality: N/A;    Current Outpatient Prescriptions  Medication Sig Dispense Refill  . metoprolol tartrate (LOPRESSOR) 25 MG tablet Take 0.5 tablets (12.5 mg total) by mouth 2 (two) times daily. 30 tablet 3  . omeprazole (PRILOSEC) 20 MG capsule Take 20 mg by mouth daily.    . Pitavastatin  Calcium (LIVALO) 2 MG TABS Take 2 mg by mouth daily.     . rivaroxaban (XARELTO) 20 MG TABS tablet Take 1 tablet (20 mg total) by mouth daily with supper. (Patient taking differently: Take 20 mg by mouth daily after breakfast. ) 90 tablet 3  . buPROPion (WELLBUTRIN XL) 150 MG 24 hr tablet Take 300 mg by mouth daily.      No current facility-administered medications for this encounter.     No Known Allergies  Social History   Social History  . Marital status: Married    Spouse name: N/A  . Number of children: N/A  . Years of education: N/A   Occupational History  . Not on file.   Social History Main Topics  . Smoking status: Never Smoker  . Smokeless tobacco: Never Used  . Alcohol use Yes     Comment: occasionally  . Drug use: No  . Sexual activity: Not on file   Other Topics Concern  . Not on file   Social History Narrative  . No narrative on file    Family History  Problem Relation Age of Onset  . Heart Problems Father   . Alzheimer's disease Sister   . Stroke Brother   . Heart Problems Brother   . Heart Problems Brother     ROS- All systems are reviewed and negative except as per the HPI above  Physical Exam: Vitals:   10/12/16 0857  BP: 132/86  Pulse: 61  Weight: 249 lb 3.2 oz (113 kg)  Height: 5\' 8"  (1.727 m)   Wt Readings from Last 3  Encounters:  10/12/16 249 lb 3.2 oz (113 kg)  10/03/16 245 lb (111.1 kg)  09/27/16 245 lb 12.8 oz (111.5 kg)    Labs: Lab Results  Component Value Date   NA 136 09/27/2016   K 4.6 09/27/2016   CL 103 09/27/2016   CO2 27 09/27/2016   GLUCOSE 111 (H) 09/27/2016   BUN 10 09/27/2016   CREATININE 1.08 09/27/2016   CALCIUM 9.3 09/27/2016   MG 2.0 07/23/2016   Lab Results  Component Value Date   INR 1.1 07/10/2016   No results found for: CHOL, HDL, LDLCALC, TRIG   GEN- The patient is well appearing, alert and oriented x 3 today.   Head- normocephalic, atraumatic Eyes-  Sclera clear, conjunctiva pink Ears-  hearing intact Oropharynx- clear Neck- supple, no JVP Lymph- no cervical lymphadenopathy Lungs- Clear to ausculation bilaterally, normal work of breathing Heart- regular rate and rhythm, no murmurs, rubs or gallops, PMI not laterally displaced GI- soft, NT, ND, + BS Extremities- no clubbing, cyanosis, or edema MS- no significant deformity or atrophy Skin- no rash or lesion Psych- euthymic mood, full affect Neuro- strength and sensation are intact  EKG- NSR at 61 bpm, pr int 178 ms, qrs int 82 ms, qtc 418 ms Epic records reviewed    Assessment and Plan: 1. Persistent afib since cardioversion Successful cardioversion Pt feels improved Continue metoprolol tartrate 12.5 mg bid Continue xarelto for a chadsvasc score of 1 (LV dysfunction), will be 1 at next birthday when he turns 7 in 4/19   F/u with Dr. Curt Bears 11/26 afib clinic as needed  Butch Penny C. Trey Gulbranson, [facility] [address] [place], [place] [address] [phone]

## 2016-12-10 ENCOUNTER — Encounter: Payer: Self-pay | Admitting: Cardiology

## 2016-12-10 ENCOUNTER — Ambulatory Visit (INDEPENDENT_AMBULATORY_CARE_PROVIDER_SITE_OTHER): Payer: BLUE CROSS/BLUE SHIELD | Admitting: Cardiology

## 2016-12-10 VITALS — BP 110/78 | HR 66 | Ht 68.0 in | Wt 243.0 lb

## 2016-12-10 DIAGNOSIS — I2510 Atherosclerotic heart disease of native coronary artery without angina pectoris: Secondary | ICD-10-CM

## 2016-12-10 DIAGNOSIS — I481 Persistent atrial fibrillation: Secondary | ICD-10-CM

## 2016-12-10 DIAGNOSIS — E785 Hyperlipidemia, unspecified: Secondary | ICD-10-CM | POA: Diagnosis not present

## 2016-12-10 DIAGNOSIS — I428 Other cardiomyopathies: Secondary | ICD-10-CM

## 2016-12-10 DIAGNOSIS — I4819 Other persistent atrial fibrillation: Secondary | ICD-10-CM

## 2016-12-10 NOTE — Progress Notes (Signed)
Electrophysiology Office Note   Date:  12/10/2016   ID:  [name], [name] 03-14-1950, MRN [id number]  PCP:  [name] Neer, MD  Cardiologist:  [facility] Primary Electrophysiologist:  [name] Devery Meredith Leeds, MD    Chief Complaint  Patient presents with  . Follow-up    Persistent Afib     History of Present Illness: [name] [name] is a 66 y.o. male who is being seen today for the evaluation of atrial fibrillation at the request of [name] Neer, MD. Presenting today for electrophysiology evaluation. He has a history of hyperlipidemia, gastroparesis esophageal reflux, and remote melanoma resection. He recently was diagnosed with atrial fibrillation and an office visit on May 16. He was having irregularity of his heartbeat for many years at the time. EKG performed in January showed sinus rhythm. He had a cardioversion on 07/23/16. He converted to sinus rhythm, but quickly went back into atrial fibrillation.  He had an atrial fibrillation ablation on 09/07/16.  Post ablation, he reverted back to atrial fibrillation and had a cardioversion on 10/03/16.  Today, denies symptoms of palpitations, chest pain, shortness of breath, orthopnea, PND, lower extremity edema, claudication, dizziness, presyncope, syncope, bleeding, or neurologic sequela. The patient is tolerating medications without difficulties.  Has been feeling well since his cardioversion post ablation.  He was having fatigue weakness and shortness of breath.  His cardioversion was done on 9/19.  He is remained in sinus rhythm since that time.   Past Medical History:  Diagnosis Date  . Cancer ([place])   . GERD (gastroesophageal reflux disease)   . Hyperlipidemia    Past Surgical History:  Procedure Laterality Date  . ATRIAL FIBRILLATION ABLATION N/A 09/07/2016   Procedure: Atrial Fibrillation Ablation;  Surgeon: Constance Haw, MD;  Location: [place] [facility];  Service: Cardiovascular;  Laterality: N/A;  . CARDIOVERSION N/A  07/23/2016   Procedure: CARDIOVERSION;  Surgeon: Sanda Klein, MD;  Location: [place] [facility];  Service: Cardiovascular;  Laterality: N/A;  . CARDIOVERSION N/A 10/03/2016   Procedure: CARDIOVERSION;  Surgeon: Josue Hector, MD;  Location: [facility];  Service: Cardiovascular;  Laterality: N/A;     Current Outpatient Medications  Medication Sig Dispense Refill  . buPROPion (WELLBUTRIN XL) 150 MG 24 hr tablet Take 300 mg by mouth daily.     . metoprolol tartrate (LOPRESSOR) 25 MG tablet Take 0.5 tablets (12.5 mg total) by mouth 2 (two) times daily. 30 tablet 3  . omeprazole (PRILOSEC) 20 MG capsule Take 20 mg by mouth daily.    . Pitavastatin Calcium (LIVALO) 2 MG TABS Take 2 mg by mouth daily.     . rivaroxaban (XARELTO) 20 MG TABS tablet Take 1 tablet (20 mg total) by mouth daily with supper. (Patient taking differently: Take 20 mg by mouth daily after breakfast. ) 90 tablet 3   No current facility-administered medications for this visit.     Allergies:   Patient has no known allergies.   Social History:  The patient  reports that  has never smoked. he has never used smokeless tobacco. He reports that he drinks alcohol. He reports that he does not use drugs.   Family History:  The patient's family history includes Alzheimer's disease in his sister; Heart Problems in his brother, brother, and father; Stroke in his brother.   ROS:  Please see the history of present illness.   Otherwise, review of systems is positive for chills, sweats, fatigue, facial swelling, cough, dyspnea on exertion, depression, back pain, anxiety.  All other systems are reviewed and negative.   PHYSICAL EXAM: VS:  BP 110/78   Pulse 66   Ht 5\' 8"  (1.727 m)   Wt 239 lb (108.4 kg)   BMI 36.34 kg/m  , BMI Body mass index is 36.34 kg/m. GEN: Well nourished, well developed, in no acute distress  HEENT: normal  Neck: no JVD, carotid bruits, or masses Cardiac: RRR; no murmurs, rubs, or gallops,no edema    Respiratory:  clear to auscultation bilaterally, normal work of breathing GI: soft, nontender, nondistended, + BS MS: no deformity or atrophy  Skin: warm and dry Neuro:  Strength and sensation are intact Psych: euthymic mood, full affect  EKG:  EKG is ordered today. Personal review of the ekg ordered shows sinus rhythm, left axis deviation rate 66  Recent Labs: 07/10/2016: TSH 2.080 07/23/2016: Magnesium 2.0 09/27/2016: BUN 10; Creatinine, Ser 1.08; Hemoglobin 15.7; Platelets 187; Potassium 4.6; Sodium 136    Lipid Panel  No results found for: CHOL, TRIG, HDL, CHOLHDL, VLDL, LDLCALC, LDLDIRECT   Wt Readings from Last 3 Encounters:  12/10/16 239 lb (108.4 kg)  10/12/16 249 lb 3.2 oz (113 kg)  10/03/16 245 lb (111.1 kg)      Other studies Reviewed: Additional studies/ records that were reviewed today include: TTE 06/26/16  Review of the above records today demonstrates:  - Left ventricle: The cavity size was normal. Systolic function was   mildly to moderately reduced. The estimated ejection fraction was   in the range of 40% to 45%. Diffuse hypokinesis. The study is not   technically sufficient to allow evaluation of LV diastolic   function. - Aortic valve: Trileaflet; mildly thickened, mildly calcified   leaflets.   ASSESSMENT AND PLAN:  1.  Atrial fibrillation: Currently on Xarelto.  Had an atrial fibrillation ablation on 09/07/16.  Had recurrence of atrial fibrillation and has been cardioverted without issue.  Remains in sinus rhythm today.  Due to his low stroke risk.  We Kimm Sider stop his Xarelto today.  This patients CHA2DS2-VASc Score and unadjusted Ischemic Stroke Rate (% per year) is equal to 0.6 % stroke rate/year from a score of 1  Above score calculated as 1 point each if present [CHF, HTN, DM, Vascular=MI/PAD/Aortic Plaque, Age if 65-74, or Male] Above score calculated as 2 points each if present [Age > 75, or Stroke/TIA/TE]  2. Hyperlipidemia: Continue  statin  3.  Likely nonischemic cardiomyopathy: No evidence of MI in the past.  Pattricia Weiher potentially improve with ablation.  May need a repeat echo in the future.  4. Coronary artery calcification: Found on CT scanning.  No current chest pain.  No changes.  Current medicines are reviewed at length with the patient today.   The patient does not have concerns regarding his medicines.  The following changes were made today:  Stop Xarelto  Labs/ tests ordered today include:  Orders Placed This Encounter  Procedures  . EKG 12-Lead     Disposition:   FU with Tarryn Bogdan 3 month  Signed, Jamiere Gulas Meredith Leeds, MD  12/10/2016 9:38 AM     [facility] [address] [place] [address] [phone] (office) [phone] (fax)

## 2016-12-10 NOTE — Patient Instructions (Signed)
Medication Instructions:  Your physician has recommended you make the following change in your medication:  1. STOP Xarelto  * If you need a refill on your cardiac medications before your next appointment, please call your pharmacy. *  Labwork: None ordered  Testing/Procedures: None ordered  Follow-Up: Your physician recommends that you schedule a follow-up appointment in: 3 months with Dr. Curt Bears.  Thank you for choosing [facility]!!   Trinidad Curet, RN [phone]  Any Other Special Instructions Will Be Listed Below (If Applicable).

## 2017-03-11 ENCOUNTER — Encounter: Payer: Self-pay | Admitting: Cardiology

## 2017-03-11 ENCOUNTER — Encounter (INDEPENDENT_AMBULATORY_CARE_PROVIDER_SITE_OTHER): Payer: Self-pay

## 2017-03-11 ENCOUNTER — Ambulatory Visit (INDEPENDENT_AMBULATORY_CARE_PROVIDER_SITE_OTHER): Payer: BLUE CROSS/BLUE SHIELD | Admitting: Cardiology

## 2017-03-11 VITALS — BP 120/84 | HR 68 | Ht 68.0 in | Wt 248.0 lb

## 2017-03-11 DIAGNOSIS — I4819 Other persistent atrial fibrillation: Secondary | ICD-10-CM

## 2017-03-11 DIAGNOSIS — E785 Hyperlipidemia, unspecified: Secondary | ICD-10-CM

## 2017-03-11 DIAGNOSIS — I428 Other cardiomyopathies: Secondary | ICD-10-CM

## 2017-03-11 DIAGNOSIS — I481 Persistent atrial fibrillation: Secondary | ICD-10-CM

## 2017-03-11 NOTE — Progress Notes (Signed)
Electrophysiology Office Note   Date:  03/11/2017   ID:  [name] [name], [name] [name] 08-31-1950, MRN [id number]  PCP:  Mayra Neer, MD  Cardiologist:  Croitrou Primary Electrophysiologist:  Lorianna Spadaccini Meredith Leeds, MD    Chief Complaint  Patient presents with  . Follow-up    Persistent Afib     History of Present Illness: [name] [name] is a 67 y.o. male who is being seen today for the evaluation of atrial fibrillation at the request of Mayra Neer, MD. Presenting today for electrophysiology evaluation. He has a history of hyperlipidemia, gastroparesis esophageal reflux, and remote melanoma resection. He recently was diagnosed with atrial fibrillation and an office visit on May 16. He was having irregularity of his heartbeat for many years at the time. EKG performed in January showed sinus rhythm. He had a cardioversion on 07/23/16. He converted to sinus rhythm, but quickly went back into atrial fibrillation.  He had an atrial fibrillation ablation on 09/07/16.  Post ablation, he reverted back to atrial fibrillation and had a cardioversion on 10/03/16.  Today, denies symptoms of palpitations, chest pain, shortness of breath, orthopnea, PND, lower extremity edema, claudication, dizziness, presyncope, syncope, bleeding, or neurologic sequela. The patient is tolerating medications without difficulties.  He is noted no further episodes of atrial fibrillation.  He is overall felt well without major complaint.   Past Medical History:  Diagnosis Date  . Cancer ([place])   . GERD (gastroesophageal reflux disease)   . Hyperlipidemia    Past Surgical History:  Procedure Laterality Date  . ATRIAL FIBRILLATION ABLATION N/A 09/07/2016   Procedure: Atrial Fibrillation Ablation;  Surgeon: Constance Haw, MD;  Location: [place] [facility];  Service: Cardiovascular;  Laterality: N/A;  . CARDIOVERSION N/A 07/23/2016   Procedure: CARDIOVERSION;  Surgeon: Sanda Klein, MD;  Location: [facility];   Service: Cardiovascular;  Laterality: N/A;  . CARDIOVERSION N/A 10/03/2016   Procedure: CARDIOVERSION;  Surgeon: Josue Hector, MD;  Location: [facility] ENDOSCOPY;  Service: Cardiovascular;  Laterality: N/A;     Current Outpatient Medications  Medication Sig Dispense Refill  . buPROPion (WELLBUTRIN XL) 150 MG 24 hr tablet Take 300 mg by mouth daily.     . metoprolol tartrate (LOPRESSOR) 25 MG tablet Take 0.5 tablets (12.5 mg total) by mouth 2 (two) times daily. 30 tablet 3  . omeprazole (PRILOSEC) 20 MG capsule Take 20 mg by mouth daily.    . Pitavastatin Calcium (LIVALO) 2 MG TABS Take 2 mg by mouth daily.      No current facility-administered medications for this visit.     Allergies:   Patient has no known allergies.   Social History:  The patient  reports that  has never smoked. he has never used smokeless tobacco. He reports that he drinks alcohol. He reports that he does not use drugs.   Family History:  The patient's family history includes Alzheimer's disease in his sister; Heart Problems in his brother, brother, and father; Stroke in his brother.   ROS:  Please see the history of present illness.   Otherwise, review of systems is positive for none.   All other systems are reviewed and negative.   PHYSICAL EXAM: VS:  BP 120/84   Pulse 68   Ht 5\' 8"  (1.727 m)   Wt 248 lb (112.5 kg)   BMI 37.71 kg/m  , BMI Body mass index is 37.71 kg/m. GEN: Well nourished, well developed, in no acute distress  HEENT: normal  Neck: no JVD,  carotid bruits, or masses Cardiac: RRR; no murmurs, rubs, or gallops,no edema  Respiratory:  clear to auscultation bilaterally, normal work of breathing GI: soft, nontender, nondistended, + BS MS: no deformity or atrophy  Skin: warm and dry Neuro:  Strength and sensation are intact Psych: euthymic mood, full affect  EKG:  EKG is not ordered today. Personal review of the ekg ordered 12/10/16 shows SR, LAD, rate 66   Recent Labs: 07/10/2016: TSH  2.080 07/23/2016: Magnesium 2.0 09/27/2016: BUN 10; Creatinine, Ser 1.08; Hemoglobin 15.7; Platelets 187; Potassium 4.6; Sodium 136    Lipid Panel  No results found for: CHOL, TRIG, HDL, CHOLHDL, VLDL, LDLCALC, LDLDIRECT   Wt Readings from Last 3 Encounters:  03/11/17 248 lb (112.5 kg)  12/10/16 243 lb (110.2 kg)  10/12/16 249 lb 3.2 oz (113 kg)      Other studies Reviewed: Additional studies/ records that were reviewed today include: TTE 06/26/16  Review of the above records today demonstrates:  - Left ventricle: The cavity size was normal. Systolic function was   mildly to moderately reduced. The estimated ejection fraction was   in the range of 40% to 45%. Diffuse hypokinesis. The study is not   technically sufficient to allow evaluation of LV diastolic   function. - Aortic valve: Trileaflet; mildly thickened, mildly calcified   leaflets.   ASSESSMENT AND PLAN:  1.  Atrial fibrillation: She Renarda Mullinix fibrillation ablation on 09/07/16.  He had recurrence and required cardioversion.  He has had no further recurrences.  He is not on Xarelto due to low stroke risk.  No changes.  This patients CHA2DS2-VASc Score and unadjusted Ischemic Stroke Rate (% per year) is equal to 0.6 % stroke rate/year from a score of 1  Above score calculated as 1 point each if present [CHF, HTN, DM, Vascular=MI/PAD/Aortic Plaque, Age if 65-74, or Male] Above score calculated as 2 points each if present [Age > 75, or Stroke/TIA/TE]   2. Hyperlipidemia: Continue statin  3.  Likely nonischemic cardiomyopathy: Ejection fraction 40-45%.  Possibly due to atrial fibrillation.  We Azaleah Usman get a repeat echo next visit.  4. Coronary artery calcification: Found on CT scanning.  No current chest pain.  No changes.  Current medicines are reviewed at length with the patient today.   The patient does not have concerns regarding his medicines.  The following changes were made today:  none  Labs/ tests ordered today  include:  No orders of the defined types were placed in this encounter.    Disposition:   FU with Loistine Eberlin 6 month  Signed, Melva Faux Meredith Leeds, MD  03/11/2017 11:37 AM     [facility] [address] [place] [address] [phone] (office) [phone] (fax)

## 2017-03-11 NOTE — Patient Instructions (Signed)
Medication Instructions:  Your physician recommends that you continue on your current medications as directed. Please refer to the Current Medication list given to you today.  * If you need a refill on your cardiac medications before your next appointment, please call your pharmacy. *  Labwork: None ordered  Testing/Procedures: Your physician has requested that you have an echocardiogram in 6 months - prior to your follow up with Dr. Curt Bears. Echocardiography is a painless test that uses sound waves to create images of your heart. It provides your doctor with information about the size and shape of your heart and how well your heart's chambers and valves are working. This procedure takes approximately one hour. There are no restrictions for this procedure.  Follow-Up: Your physician wants you to follow-up in: 6 months with Dr. Curt Bears (after you have completed your echocardiogram).  You will receive a reminder letter in the mail two months in advance. If you don't receive a letter, please call our office to schedule the follow-up appointment.  Thank you for choosing [facility]!!   Trinidad Curet, RN [phone]

## 2017-03-11 NOTE — Addendum Note (Signed)
Addended by: Stanton Kidney on: 03/11/2017 12:01 PM   Modules accepted: Orders

## 2017-04-18 DIAGNOSIS — D225 Melanocytic nevi of trunk: Secondary | ICD-10-CM | POA: Diagnosis not present

## 2017-04-18 DIAGNOSIS — D485 Neoplasm of uncertain behavior of skin: Secondary | ICD-10-CM | POA: Diagnosis not present

## 2017-05-28 DIAGNOSIS — R972 Elevated prostate specific antigen [PSA]: Secondary | ICD-10-CM | POA: Diagnosis not present

## 2017-07-02 DIAGNOSIS — L821 Other seborrheic keratosis: Secondary | ICD-10-CM | POA: Diagnosis not present

## 2017-07-02 DIAGNOSIS — D1801 Hemangioma of skin and subcutaneous tissue: Secondary | ICD-10-CM | POA: Diagnosis not present

## 2017-07-02 DIAGNOSIS — L814 Other melanin hyperpigmentation: Secondary | ICD-10-CM | POA: Diagnosis not present

## 2017-07-02 DIAGNOSIS — L570 Actinic keratosis: Secondary | ICD-10-CM | POA: Diagnosis not present

## 2017-07-02 DIAGNOSIS — Z86018 Personal history of other benign neoplasm: Secondary | ICD-10-CM | POA: Diagnosis not present

## 2017-07-02 DIAGNOSIS — Z85820 Personal history of malignant melanoma of skin: Secondary | ICD-10-CM | POA: Diagnosis not present

## 2017-07-02 DIAGNOSIS — D225 Melanocytic nevi of trunk: Secondary | ICD-10-CM | POA: Diagnosis not present

## 2017-09-09 ENCOUNTER — Encounter (INDEPENDENT_AMBULATORY_CARE_PROVIDER_SITE_OTHER): Payer: Self-pay

## 2017-09-09 ENCOUNTER — Other Ambulatory Visit: Payer: Self-pay

## 2017-09-09 ENCOUNTER — Ambulatory Visit (HOSPITAL_COMMUNITY): Payer: Medicare Other | Attending: Cardiovascular Disease

## 2017-09-09 DIAGNOSIS — I4819 Other persistent atrial fibrillation: Secondary | ICD-10-CM

## 2017-09-09 DIAGNOSIS — I481 Persistent atrial fibrillation: Secondary | ICD-10-CM | POA: Diagnosis not present

## 2017-09-09 DIAGNOSIS — I428 Other cardiomyopathies: Secondary | ICD-10-CM

## 2017-09-09 DIAGNOSIS — E785 Hyperlipidemia, unspecified: Secondary | ICD-10-CM | POA: Diagnosis not present

## 2017-09-12 ENCOUNTER — Encounter: Payer: Self-pay | Admitting: Cardiology

## 2017-09-12 ENCOUNTER — Ambulatory Visit (INDEPENDENT_AMBULATORY_CARE_PROVIDER_SITE_OTHER): Payer: Medicare Other | Admitting: Cardiology

## 2017-09-12 VITALS — BP 110/68 | HR 59 | Ht 68.0 in | Wt 244.6 lb

## 2017-09-12 DIAGNOSIS — E785 Hyperlipidemia, unspecified: Secondary | ICD-10-CM | POA: Diagnosis not present

## 2017-09-12 DIAGNOSIS — Z79899 Other long term (current) drug therapy: Secondary | ICD-10-CM | POA: Diagnosis not present

## 2017-09-12 DIAGNOSIS — I481 Persistent atrial fibrillation: Secondary | ICD-10-CM

## 2017-09-12 DIAGNOSIS — I4819 Other persistent atrial fibrillation: Secondary | ICD-10-CM

## 2017-09-12 DIAGNOSIS — I428 Other cardiomyopathies: Secondary | ICD-10-CM | POA: Diagnosis not present

## 2017-09-12 DIAGNOSIS — I2510 Atherosclerotic heart disease of native coronary artery without angina pectoris: Secondary | ICD-10-CM

## 2017-09-12 NOTE — Patient Instructions (Addendum)
Medication Instructions:  Your physician has recommended you make the following change in your medication: 1. STOP Metoprolol  * If you need a refill on your cardiac medications before your next appointment, please call your pharmacy.   Labwork: Your physician recommends that you return for lab work for [place] blood work: Lipid panel & liver function  Testing/Procedures: None ordered  Follow-Up: Your physician wants you to follow-up in: 6 months with Dr. Curt Bears.  You will receive a reminder letter in the mail two months in advance. If you don't receive a letter, please call our office to schedule the follow-up appointment.  Thank you for choosing [facility]!!   Trinidad Curet, RN [phone]

## 2017-09-12 NOTE — Progress Notes (Signed)
Electrophysiology Office Note   Date:  09/12/2017   ID:  Jahi, Roza 1950-03-25, MRN [id number]  PCP:  Mayra Neer, MD  Cardiologist:  Croitrou Primary Electrophysiologist:  Uel Davidow Meredith Leeds, MD    No chief complaint on file.    History of Present Illness: [name] [name] is a 67 y.o. male who is being seen today for the evaluation of atrial fibrillation at the request of Mayra Neer, MD. Presenting today for electrophysiology evaluation. He has a history of hyperlipidemia, gastroparesis esophageal reflux, and remote melanoma resection. He was diagnosed with atrial fibrillation and an office visit on May 16.  He had a cardioversion on 07/23/16. He converted to sinus rhythm, but quickly went back into atrial fibrillation.  He had an atrial fibrillation ablation on 09/07/16.  Post ablation, he reverted back to atrial fibrillation and had a cardioversion on 10/03/16.  Today, denies symptoms of palpitations, chest pain, shortness of breath, orthopnea, PND, lower extremity edema, claudication, dizziness, presyncope, syncope, bleeding, or neurologic sequela. The patient is tolerating medications without difficulties.  Overall he is doing well.  He unfortunately does have some fatigue.  This may be due to his metoprolol.  He has noted no further episodes of atrial fibrillation.   Past Medical History:  Diagnosis Date  . Cancer ([place])   . GERD (gastroesophageal reflux disease)   . Hyperlipidemia    Past Surgical History:  Procedure Laterality Date  . ATRIAL FIBRILLATION ABLATION N/A 09/07/2016   Procedure: Atrial Fibrillation Ablation;  Surgeon: Constance Haw, MD;  Location: [place] [facility];  Service: Cardiovascular;  Laterality: N/A;  . CARDIOVERSION N/A 07/23/2016   Procedure: CARDIOVERSION;  Surgeon: Sanda Klein, MD;  Location: [place] [facility];  Service: Cardiovascular;  Laterality: N/A;  . CARDIOVERSION N/A 10/03/2016   Procedure: CARDIOVERSION;  Surgeon: Josue Hector, MD;  Location: [facility];  Service: Cardiovascular;  Laterality: N/A;     Current Outpatient Medications  Medication Sig Dispense Refill  . buPROPion (WELLBUTRIN XL) 150 MG 24 hr tablet Take 300 mg by mouth daily.     [name] omeprazole (PRILOSEC) 20 MG capsule Take 20 mg by mouth daily.    . Pitavastatin Calcium (LIVALO) 2 MG TABS Take 2 mg by mouth daily.     . metoprolol tartrate (LOPRESSOR) 25 MG tablet Take 0.5 tablets (12.5 mg total) by mouth 2 (two) times daily. 30 tablet 3   No current facility-administered medications for this visit.     Allergies:   Patient has no known allergies.   Social History:  The patient  reports that he has never smoked. He has never used smokeless tobacco. He reports that he drinks alcohol. He reports that he does not use drugs.   Family History:  The patient's family history includes Alzheimer's disease in his sister; Heart Problems in his brother, brother, and father; Stroke in his brother.   ROS:  Please see the history of present illness.   Otherwise, review of systems is positive for none.   All other systems are reviewed and negative.   PHYSICAL EXAM: VS:  BP 110/68   Pulse (!) 59   Ht 5\' 8"  (1.727 m)   Wt 244 lb 9.6 oz (110.9 kg)   SpO2 95%   BMI 37.19 kg/m  , BMI Body mass index is 37.19 kg/m. GEN: Well nourished, well developed, in no acute distress  HEENT: normal  Neck: no JVD, carotid bruits, or masses Cardiac: RRR; no murmurs, rubs, or gallops,no  edema  Respiratory:  clear to auscultation bilaterally, normal work of breathing GI: soft, nontender, nondistended, + BS MS: no deformity or atrophy  Skin: warm and dry Neuro:  Strength and sensation are intact Psych: euthymic mood, full affect  EKG:  EKG is ordered today. Personal review of the ekg ordered shows sinus rhythm, left axis deviation, rate 59   Recent Labs: 09/27/2016: BUN 10; Creatinine, Ser 1.08; Hemoglobin 15.7; Platelets 187; Potassium 4.6; Sodium 136     Lipid Panel  No results found for: CHOL, TRIG, HDL, CHOLHDL, VLDL, LDLCALC, LDLDIRECT   Wt Readings from Last 3 Encounters:  09/12/17 244 lb 9.6 oz (110.9 kg)  03/11/17 248 lb (112.5 kg)  12/10/16 243 lb (110.2 kg)      Other studies Reviewed: Additional studies/ records that were reviewed today include: TTE 09/09/17  Review of the above records today demonstrates:  - Left ventricle: The cavity size was normal. Systolic function was   normal. The estimated ejection fraction was in the range of 55%   to 60%. Wall motion was normal; there were no regional wall   motion abnormalities. Features are consistent with a pseudonormal   left ventricular filling pattern, with concomitant abnormal   relaxation and increased filling pressure (grade 2 diastolic   dysfunction). Doppler parameters are consistent with   indeterminate ventricular filling pressure. - Aortic valve: Transvalvular velocity was within the normal range.   There was no stenosis. There was no regurgitation. - Mitral valve: Transvalvular velocity was within the normal range.   There was no evidence for stenosis. There was trivial   regurgitation. - Right ventricle: The cavity size was normal. Wall thickness was   normal. Systolic function was normal. - Tricuspid valve: There was no regurgitation.   ASSESSMENT AND PLAN:  1.  Atrial fibrillation: Post atrial fibrillation ablation 09/07/2016.  Had recurrence requiring cardioversion but no further episodes.  Due to his fatigue, we Tabrina Esty stop his metoprolol.  This patients CHA2DS2-VASc Score and unadjusted Ischemic Stroke Rate (% per year) is equal to 0.6 % stroke rate/year from a score of 1  Above score calculated as 1 point each if present [CHF, HTN, DM, Vascular=MI/PAD/Aortic Plaque, Age if 65-74, or Male] Above score calculated as 2 points each if present [Age > 75, or Stroke/TIA/TE]   2. Hyperlipidemia: Continue statin  3.  Likely nonischemic cardiomyopathy:  Ejection fraction has returned to normal.  Likely due to nonischemic cardiomyopathy possibly atrial fibrillation.  No changes at this time.  4. Coronary artery calcification: On CT scan.  No current chest pain.  We Boaz Berisha check a fasting lipid panel.  Continue statin.  Current medicines are reviewed at length with the patient today.   The patient does not have concerns regarding his medicines.  The following changes were made today:  none  Labs/ tests ordered today include:  Orders Placed This Encounter  Procedures  . EKG 12-Lead     Disposition:   FU with Dequincy Born 6 month  Signed, Jacqueline Delapena Meredith Leeds, MD  09/12/2017 9:26 AM     [facility] [address] [place] [address] [phone] (office) [phone] (fax)

## 2017-09-12 NOTE — Addendum Note (Signed)
Addended by: Stanton Kidney on: 09/12/2017 09:39 AM   Modules accepted: Orders

## 2017-09-13 ENCOUNTER — Other Ambulatory Visit: Payer: Medicare Other | Admitting: *Deleted

## 2017-09-13 DIAGNOSIS — Z79899 Other long term (current) drug therapy: Secondary | ICD-10-CM | POA: Diagnosis not present

## 2017-09-13 DIAGNOSIS — E785 Hyperlipidemia, unspecified: Secondary | ICD-10-CM

## 2017-09-13 LAB — HEPATIC FUNCTION PANEL
ALT: 23 IU/L (ref 0–44)
AST: 19 IU/L (ref 0–40)
Albumin: 4.1 g/dL (ref 3.6–4.8)
Alkaline Phosphatase: 59 IU/L (ref 39–117)
Bilirubin Total: 1.1 mg/dL (ref 0.0–1.2)
Bilirubin, Direct: 0.24 mg/dL (ref 0.00–0.40)
Total Protein: 6.5 g/dL (ref 6.0–8.5)

## 2017-09-13 LAB — LIPID PANEL
CHOL/HDL RATIO: 4 ratio (ref 0.0–5.0)
CHOLESTEROL TOTAL: 182 mg/dL (ref 100–199)
HDL: 45 mg/dL (ref >39)
LDL CALC: 115 mg/dL — AB (ref 0–99)
Triglycerides: 109 mg/dL (ref 0–149)
VLDL Cholesterol Cal: 22 mg/dL (ref 5–40)

## 2017-09-24 ENCOUNTER — Other Ambulatory Visit: Payer: Self-pay | Admitting: Family Medicine

## 2017-09-24 ENCOUNTER — Ambulatory Visit
Admission: RE | Admit: 2017-09-24 | Discharge: 2017-09-24 | Disposition: A | Discharge status: Home / Self Care | Payer: Medicare Other | Source: Ambulatory Visit | Attending: Family Medicine | Admitting: Family Medicine

## 2017-09-24 DIAGNOSIS — R109 Unspecified abdominal pain: Secondary | ICD-10-CM

## 2017-09-24 DIAGNOSIS — K4090 Unilateral inguinal hernia, without obstruction or gangrene, not specified as recurrent: Secondary | ICD-10-CM | POA: Diagnosis not present

## 2017-09-24 DIAGNOSIS — Z23 Encounter for immunization: Secondary | ICD-10-CM | POA: Diagnosis not present

## 2017-10-10 DIAGNOSIS — R319 Hematuria, unspecified: Secondary | ICD-10-CM | POA: Diagnosis not present

## 2017-10-24 ENCOUNTER — Other Ambulatory Visit: Payer: Self-pay | Admitting: *Deleted

## 2017-10-24 DIAGNOSIS — E7800 Pure hypercholesterolemia, unspecified: Secondary | ICD-10-CM

## 2017-10-24 MED ORDER — PITAVASTATIN CALCIUM 4 MG PO TABS: 4.0000 mg | ORAL_TABLET | Freq: Every day | ORAL | 1 refills | Status: DC

## 2018-02-03 ENCOUNTER — Other Ambulatory Visit: Payer: Medicare Other

## 2018-02-25 ENCOUNTER — Ambulatory Visit
Admission: RE | Admit: 2018-02-25 | Discharge: 2018-02-25 | Disposition: A | Discharge status: Home / Self Care | Payer: Medicare Other | Source: Ambulatory Visit | Attending: Physician Assistant | Admitting: Physician Assistant

## 2018-02-25 ENCOUNTER — Other Ambulatory Visit: Payer: Self-pay | Admitting: Physician Assistant

## 2018-02-25 DIAGNOSIS — M19012 Primary osteoarthritis, left shoulder: Secondary | ICD-10-CM | POA: Diagnosis not present

## 2018-02-25 DIAGNOSIS — R52 Pain, unspecified: Secondary | ICD-10-CM

## 2018-02-25 DIAGNOSIS — M25512 Pain in left shoulder: Secondary | ICD-10-CM | POA: Diagnosis not present

## 2018-02-25 DIAGNOSIS — W19XXXA Unspecified fall, initial encounter: Secondary | ICD-10-CM | POA: Diagnosis not present

## 2018-02-26 ENCOUNTER — Telehealth: Payer: Self-pay | Admitting: Cardiology

## 2018-02-26 NOTE — Telephone Encounter (Signed)
Spoke to the patient because he is concerned about the increased price of his Livalo medication, which apparently insurance is not covering.  I told him that I would forward to Dr Macky Lower nurse for further recommendations along with our patient medication representative.  He verbalized understanding.

## 2018-02-26 NOTE — Telephone Encounter (Signed)
New Message   Pt c/o medication issue:  1. Name of Medication: livalo   2. How are you currently taking this medication (dosage and times per day)?   3. Are you having a reaction (difficulty breathing--STAT)?   4. What is your medication issue?Kathlee Nations with [facility] is calling because insurance does not cover Livalo and they want to discuss alternatives.

## 2018-02-27 NOTE — Telephone Encounter (Signed)
Called patient left voice mail to return call Need to find out what prescription insurance he has- only see medical insurance in chart- so I can review the formulary Also need to see if he has ever been on rosuvastatin (crestor) before, I do not see it on his medication list- this could be an alternative if we are not able to get Livalo.

## 2018-02-27 NOTE — Telephone Encounter (Signed)
Routing to pharmacist to address/advise

## 2018-03-04 NOTE — Telephone Encounter (Signed)
PA for Livalo sent and approved. Patient and Wife made aware.  [facility] ID [id number] Rx BIN [phone]  [employer]

## 2018-03-25 DIAGNOSIS — R7309 Other abnormal glucose: Secondary | ICD-10-CM | POA: Diagnosis not present

## 2018-03-25 DIAGNOSIS — F411 Generalized anxiety disorder: Secondary | ICD-10-CM | POA: Diagnosis not present

## 2018-03-25 DIAGNOSIS — K4090 Unilateral inguinal hernia, without obstruction or gangrene, not specified as recurrent: Secondary | ICD-10-CM | POA: Diagnosis not present

## 2018-03-25 DIAGNOSIS — E7800 Pure hypercholesterolemia, unspecified: Secondary | ICD-10-CM | POA: Diagnosis not present

## 2018-03-25 DIAGNOSIS — K429 Umbilical hernia without obstruction or gangrene: Secondary | ICD-10-CM | POA: Diagnosis not present

## 2018-03-25 DIAGNOSIS — Z1159 Encounter for screening for other viral diseases: Secondary | ICD-10-CM | POA: Diagnosis not present

## 2018-03-25 DIAGNOSIS — Z0000 Encounter for general adult medical examination without abnormal findings: Secondary | ICD-10-CM | POA: Diagnosis not present

## 2018-03-25 DIAGNOSIS — R972 Elevated prostate specific antigen [PSA]: Secondary | ICD-10-CM | POA: Diagnosis not present

## 2018-03-25 DIAGNOSIS — Z23 Encounter for immunization: Secondary | ICD-10-CM | POA: Diagnosis not present

## 2018-03-25 DIAGNOSIS — S51831A Puncture wound without foreign body of right forearm, initial encounter: Secondary | ICD-10-CM | POA: Diagnosis not present

## 2018-03-25 DIAGNOSIS — E669 Obesity, unspecified: Secondary | ICD-10-CM | POA: Diagnosis not present

## 2018-03-25 DIAGNOSIS — Z125 Encounter for screening for malignant neoplasm of prostate: Secondary | ICD-10-CM | POA: Diagnosis not present

## 2018-03-25 DIAGNOSIS — K219 Gastro-esophageal reflux disease without esophagitis: Secondary | ICD-10-CM | POA: Diagnosis not present

## 2018-03-27 ENCOUNTER — Other Ambulatory Visit: Payer: Self-pay | Admitting: Family Medicine

## 2018-03-27 DIAGNOSIS — Z136 Encounter for screening for cardiovascular disorders: Secondary | ICD-10-CM

## 2018-05-06 DIAGNOSIS — M25512 Pain in left shoulder: Secondary | ICD-10-CM | POA: Diagnosis not present

## 2018-05-09 DIAGNOSIS — M25512 Pain in left shoulder: Secondary | ICD-10-CM | POA: Diagnosis not present

## 2018-05-14 DIAGNOSIS — M25512 Pain in left shoulder: Secondary | ICD-10-CM | POA: Diagnosis not present

## 2018-05-16 DIAGNOSIS — M25512 Pain in left shoulder: Secondary | ICD-10-CM | POA: Diagnosis not present

## 2018-05-21 DIAGNOSIS — M25512 Pain in left shoulder: Secondary | ICD-10-CM | POA: Diagnosis not present

## 2018-05-26 DIAGNOSIS — M25512 Pain in left shoulder: Secondary | ICD-10-CM | POA: Diagnosis not present

## 2018-05-28 DIAGNOSIS — M25512 Pain in left shoulder: Secondary | ICD-10-CM | POA: Diagnosis not present

## 2018-06-03 DIAGNOSIS — M25512 Pain in left shoulder: Secondary | ICD-10-CM | POA: Diagnosis not present

## 2018-06-10 DIAGNOSIS — M25512 Pain in left shoulder: Secondary | ICD-10-CM | POA: Diagnosis not present

## 2018-06-12 DIAGNOSIS — M25512 Pain in left shoulder: Secondary | ICD-10-CM | POA: Diagnosis not present

## 2018-08-06 ENCOUNTER — Ambulatory Visit
Admission: RE | Admit: 2018-08-06 | Discharge: 2018-08-06 | Disposition: A | Discharge status: Home / Self Care | Payer: Medicare Other | Source: Ambulatory Visit | Attending: Family Medicine | Admitting: Family Medicine

## 2018-08-06 ENCOUNTER — Other Ambulatory Visit: Payer: Self-pay

## 2018-08-06 DIAGNOSIS — Z87891 Personal history of nicotine dependence: Secondary | ICD-10-CM | POA: Diagnosis not present

## 2018-08-06 DIAGNOSIS — Z136 Encounter for screening for cardiovascular disorders: Secondary | ICD-10-CM | POA: Diagnosis not present

## 2018-09-08 DIAGNOSIS — R3911 Hesitancy of micturition: Secondary | ICD-10-CM | POA: Diagnosis not present

## 2018-09-08 DIAGNOSIS — K429 Umbilical hernia without obstruction or gangrene: Secondary | ICD-10-CM | POA: Diagnosis not present

## 2018-09-08 DIAGNOSIS — R194 Change in bowel habit: Secondary | ICD-10-CM | POA: Diagnosis not present

## 2018-10-01 DIAGNOSIS — E7800 Pure hypercholesterolemia, unspecified: Secondary | ICD-10-CM | POA: Diagnosis not present

## 2018-10-01 DIAGNOSIS — R7309 Other abnormal glucose: Secondary | ICD-10-CM | POA: Diagnosis not present

## 2018-10-02 DIAGNOSIS — Z8679 Personal history of other diseases of the circulatory system: Secondary | ICD-10-CM | POA: Diagnosis not present

## 2018-10-02 DIAGNOSIS — G4700 Insomnia, unspecified: Secondary | ICD-10-CM | POA: Diagnosis not present

## 2018-10-02 DIAGNOSIS — R7309 Other abnormal glucose: Secondary | ICD-10-CM | POA: Diagnosis not present

## 2018-10-02 DIAGNOSIS — E7800 Pure hypercholesterolemia, unspecified: Secondary | ICD-10-CM | POA: Diagnosis not present

## 2018-10-02 DIAGNOSIS — Z7189 Other specified counseling: Secondary | ICD-10-CM | POA: Diagnosis not present

## 2018-10-02 DIAGNOSIS — F411 Generalized anxiety disorder: Secondary | ICD-10-CM | POA: Diagnosis not present

## 2018-10-14 ENCOUNTER — Other Ambulatory Visit (HOSPITAL_COMMUNITY): Payer: Medicare Other

## 2018-10-20 DIAGNOSIS — H9190 Unspecified hearing loss, unspecified ear: Secondary | ICD-10-CM | POA: Diagnosis not present

## 2018-10-27 DIAGNOSIS — H6990 Unspecified Eustachian tube disorder, unspecified ear: Secondary | ICD-10-CM | POA: Diagnosis not present

## 2018-11-19 DIAGNOSIS — J069 Acute upper respiratory infection, unspecified: Secondary | ICD-10-CM | POA: Diagnosis not present

## 2018-11-19 DIAGNOSIS — R05 Cough: Secondary | ICD-10-CM | POA: Diagnosis not present

## 2018-12-08 DIAGNOSIS — H9313 Tinnitus, bilateral: Secondary | ICD-10-CM | POA: Diagnosis not present

## 2018-12-08 DIAGNOSIS — H918X3 Other specified hearing loss, bilateral: Secondary | ICD-10-CM | POA: Diagnosis not present

## 2018-12-08 DIAGNOSIS — H93231 Hyperacusis, right ear: Secondary | ICD-10-CM | POA: Diagnosis not present

## 2018-12-08 DIAGNOSIS — H903 Sensorineural hearing loss, bilateral: Secondary | ICD-10-CM | POA: Diagnosis not present

## 2018-12-16 ENCOUNTER — Ambulatory Visit: Payer: Medicare Other | Admitting: Cardiology

## 2018-12-16 DIAGNOSIS — H918X3 Other specified hearing loss, bilateral: Secondary | ICD-10-CM | POA: Diagnosis not present

## 2018-12-16 DIAGNOSIS — H9041 Sensorineural hearing loss, unilateral, right ear, with unrestricted hearing on the contralateral side: Secondary | ICD-10-CM | POA: Diagnosis not present

## 2018-12-16 NOTE — Progress Notes (Deleted)
Electrophysiology Office Note   Date:  12/16/2018   ID:  Geof, Pimenta November 02, 1950, MRN [id number]  PCP:  Mayra Neer, MD  Cardiologist:  Croitrou Primary Electrophysiologist:  Rashaud Ybarbo Meredith Leeds, MD    No chief complaint on file.    History of Present Illness: [name] [name] is a 68 y.o. male who is being seen today for the evaluation of atrial fibrillation at the request of Mayra Neer, MD. Presenting today for electrophysiology evaluation. He has a history of hyperlipidemia, gastroparesis esophageal reflux, and remote melanoma resection. He was diagnosed with atrial fibrillation and an office visit on May 16.  He had a cardioversion on 07/23/16. He converted to sinus rhythm, but quickly went back into atrial fibrillation.  He had an atrial fibrillation ablation on 09/07/16.  Post ablation, he reverted back to atrial fibrillation and had a cardioversion on 10/03/16.  Today, denies symptoms of palpitations, chest pain, shortness of breath, orthopnea, PND, lower extremity edema, claudication, dizziness, presyncope, syncope, bleeding, or neurologic sequela. The patient is tolerating medications without difficulties. ***    Past Medical History:  Diagnosis Date  . Cancer ([place])   . GERD (gastroesophageal reflux disease)   . Hyperlipidemia    Past Surgical History:  Procedure Laterality Date  . ATRIAL FIBRILLATION ABLATION N/A 09/07/2016   Procedure: Atrial Fibrillation Ablation;  Surgeon: Constance Haw, MD;  Location: [place] [facility];  Service: Cardiovascular;  Laterality: N/A;  . CARDIOVERSION N/A 07/23/2016   Procedure: CARDIOVERSION;  Surgeon: Sanda Klein, MD;  Location: [place] [facility];  Service: Cardiovascular;  Laterality: N/A;  . CARDIOVERSION N/A 10/03/2016   Procedure: CARDIOVERSION;  Surgeon: Josue Hector, MD;  Location: [facility];  Service: Cardiovascular;  Laterality: N/A;     Current Outpatient Medications  Medication Sig Dispense Refill  .  buPROPion (WELLBUTRIN XL) 150 MG 24 hr tablet Take 300 mg by mouth daily.     [name] omeprazole (PRILOSEC) 20 MG capsule Take 20 mg by mouth daily.    . Pitavastatin Calcium 4 MG TABS Take 1 tablet (4 mg total) by mouth daily. 90 tablet 1   No current facility-administered medications for this visit.     Allergies:   Patient has no known allergies.   Social History:  The patient  reports that he has never smoked. He has never used smokeless tobacco. He reports current alcohol use. He reports that he does not use drugs.   Family History:  The patient's family history includes Alzheimer's disease in his sister; Heart Problems in his brother, brother, and father; Stroke in his brother.   ROS:  Please see the history of present illness.   Otherwise, review of systems is positive for ***.   All other systems are reviewed and negative.   PHYSICAL EXAM: VS:  There were no vitals taken for this visit. , BMI There is no height or weight on file to calculate BMI. GEN: Well nourished, well developed, in no acute distress  HEENT: normal  Neck: no JVD, carotid bruits, or masses Cardiac: ***RRR; no murmurs, rubs, or gallops,no edema  Respiratory:  clear to auscultation bilaterally, normal work of breathing GI: soft, nontender, nondistended, + BS MS: no deformity or atrophy  Skin: warm and dry Neuro:  Strength and sensation are intact Psych: euthymic mood, full affect  EKG:  EKG {ACTION; IS/IS [id number] ordered today. Personal review of the ekg ordered *** shows ***    Recent Labs: No results found for requested labs within last  8760 hours.    Lipid Panel     Component Value Date/Time   CHOL 182 09/13/2017 0740   TRIG 109 09/13/2017 0740   HDL 45 09/13/2017 0740   CHOLHDL 4.0 09/13/2017 0740   LDLCALC 115 (H) 09/13/2017 0740     Wt Readings from Last 3 Encounters:  09/12/17 244 lb 9.6 oz (110.9 kg)  03/11/17 248 lb (112.5 kg)  12/10/16 243 lb (110.2 kg)      Other studies  Reviewed: Additional studies/ records that were reviewed today include: TTE 09/09/17  Review of the above records today demonstrates:  - Left ventricle: The cavity size was normal. Systolic function was   normal. The estimated ejection fraction was in the range of 55%   to 60%. Wall motion was normal; there were no regional wall   motion abnormalities. Features are consistent with a pseudonormal   left ventricular filling pattern, with concomitant abnormal   relaxation and increased filling pressure (grade 2 diastolic   dysfunction). Doppler parameters are consistent with   indeterminate ventricular filling pressure. - Aortic valve: Transvalvular velocity was within the normal range.   There was no stenosis. There was no regurgitation. - Mitral valve: Transvalvular velocity was within the normal range.   There was no evidence for stenosis. There was trivial   regurgitation. - Right ventricle: The cavity size was normal. Wall thickness was   normal. Systolic function was normal. - Tricuspid valve: There was no regurgitation.   ASSESSMENT AND PLAN:  1.  Atrial fibrillation: Status post ablation 09/07/2016.  Has had a recurrence requiring cardioversion but no further episodes.  No changes.  ***  This patients CHA2DS2-VASc Score and unadjusted Ischemic Stroke Rate (% per year) is equal to 0.6 % stroke rate/year from a score of 1  Above score calculated as 1 point each if present [CHF, HTN, DM, Vascular=MI/PAD/Aortic Plaque, Age if 65-74, or Male] Above score calculated as 2 points each if present [Age > 75, or Stroke/TIA/TE]   2. Hyperlipidemia: Continue statin  3.  Likely nonischemic cardiomyopathy: Ejection fraction has returned to normal.  Potentially due to rapid atrial fibrillation.  No changes.  4. Coronary artery calcification: Found on CT scan.  No current chest pain.  Continue with current management.  Current medicines are reviewed at length with the patient today.   The  patient does not have concerns regarding his medicines.  The following changes were made today:  ***  Labs/ tests ordered today include:  No orders of the defined types were placed in this encounter.    Disposition:   FU with Sanaz Scarlett *** month  Signed, Matelyn Antonelli Meredith Leeds, MD  12/16/2018 9:10 AM     [facility] [address] [place] [address] [phone] (office) [phone] (fax)

## 2018-12-18 IMAGING — DX DG CHEST 2V
2 series · 2 of 2 positions shown · non-contrast
Comparison: 11/25/2014

CLINICAL DATA: Atrial fibrillation

EXAM:
CHEST  2 VIEW

[dg chest 2 view (1 of 2)]
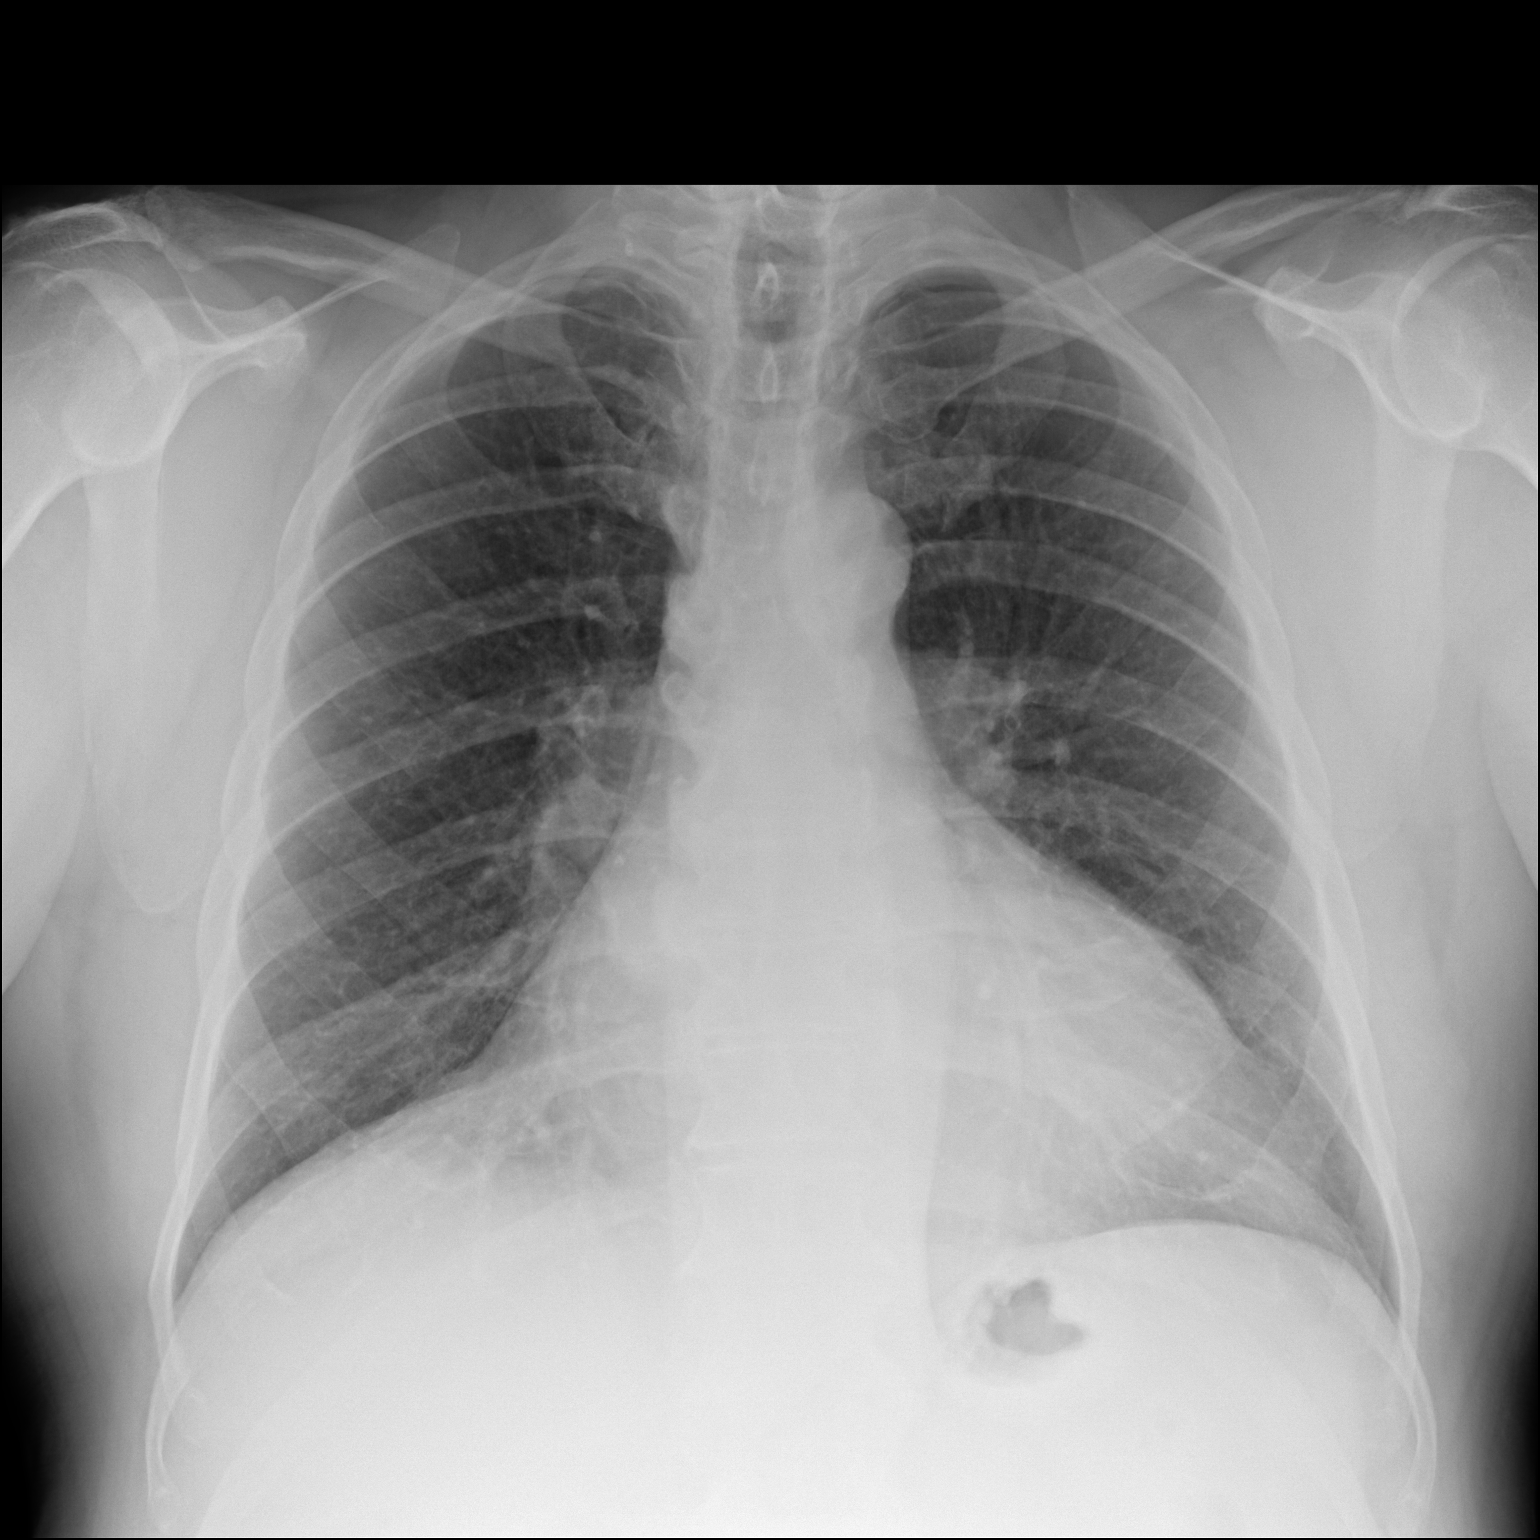

[dg chest 2 view (2 of 2)]
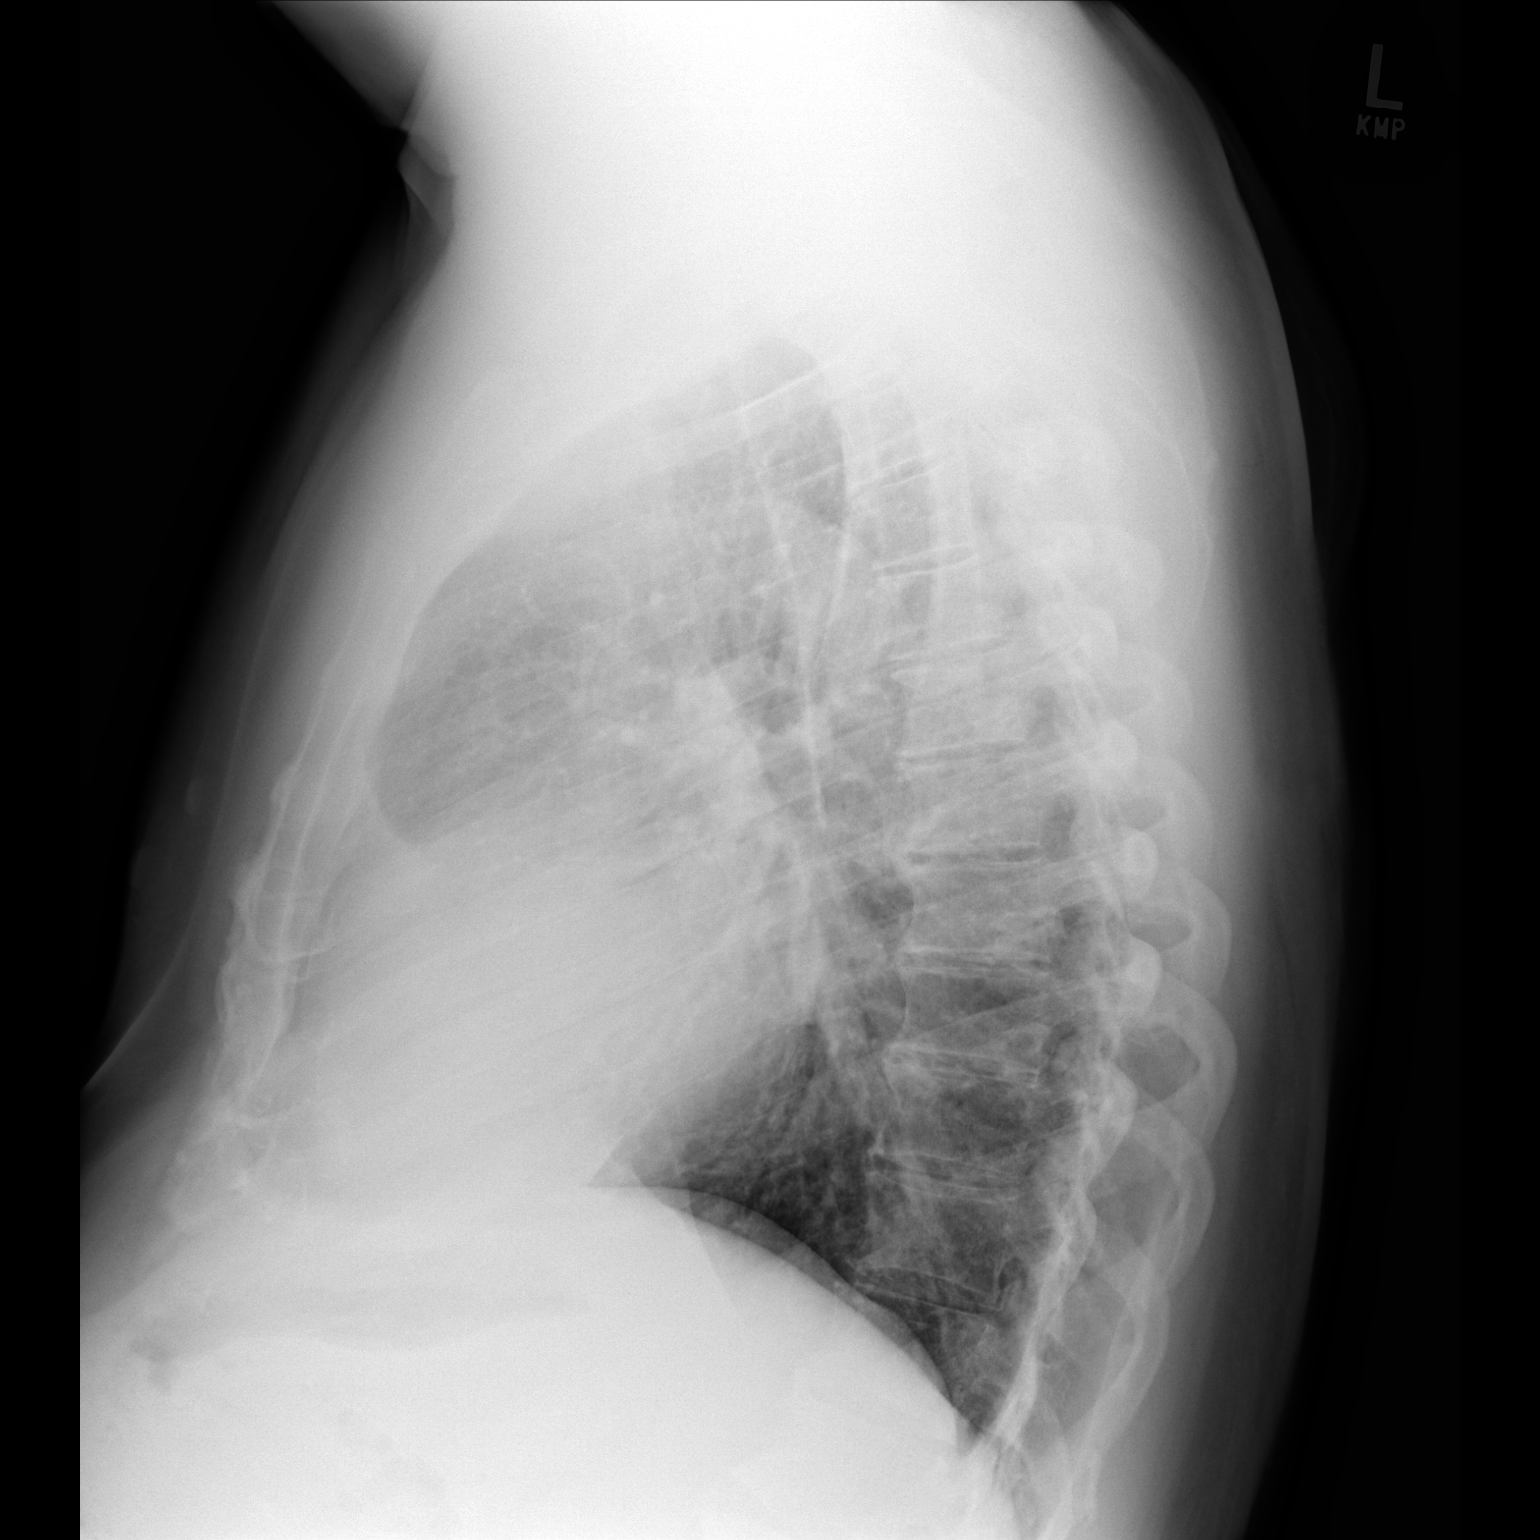

[2 of 2 positions shown; findings below may reference images not displayed]

FINDINGS: Stable cardiomegaly without overt failure. No focal consolidation or
effusion. No pneumothorax. Degenerative changes of the spine.
IMPRESSION: Cardiomegaly without edema or infiltrate

## 2018-12-23 ENCOUNTER — Encounter: Payer: Self-pay | Admitting: Cardiovascular Disease

## 2018-12-23 ENCOUNTER — Ambulatory Visit (INDEPENDENT_AMBULATORY_CARE_PROVIDER_SITE_OTHER): Payer: Medicare Other | Admitting: Cardiovascular Disease

## 2018-12-23 ENCOUNTER — Other Ambulatory Visit: Payer: Self-pay

## 2018-12-23 VITALS — BP 129/83 | HR 66 | Ht 68.0 in | Wt 251.0 lb

## 2018-12-23 DIAGNOSIS — I428 Other cardiomyopathies: Secondary | ICD-10-CM

## 2018-12-23 DIAGNOSIS — I4819 Other persistent atrial fibrillation: Secondary | ICD-10-CM | POA: Diagnosis not present

## 2018-12-23 DIAGNOSIS — E7800 Pure hypercholesterolemia, unspecified: Secondary | ICD-10-CM | POA: Diagnosis not present

## 2018-12-23 DIAGNOSIS — Z6838 Body mass index (BMI) 38.0-38.9, adult: Secondary | ICD-10-CM

## 2018-12-23 NOTE — Patient Instructions (Signed)

## 2018-12-23 NOTE — Progress Notes (Signed)
Cardiology consultation Note:    Date:  12/28/2018   ID:  [name] [name], DOB [birth year], MRN [id number]  PCP:  Mayra Neer, MD  Cardiologist:  Sanda Klein, MD    Referring MD: Mayra Neer, MD  Chief Complaint  Patient presents with  . Atrial Fibrillation    History of Present Illness:    [name] [name] is a 68 y.o. male with a hx of radiofrequency ablation for atrial fibrillation in August 2018, so far with an excellent result.  He also has hyperlipidemia, gastroesophageal reflux disease and remote melanoma resection.  Note that when he presented with atrial fibrillation his ventricular rate was spontaneously well controlled suggesting some degree of conduction system disease.  He also has left anterior fascicular block.  Preablation echocardiogram in June 2018 showed diffuse LV hypokinesis with EF of 40-45%.  Follow-up echo in August 2019 showed normal left ventricular systolic function and no serious valvular abnormalities.  Left atrial size was normal on both studies.  He had a remote normal nuclear stress test in 2008.  He has tried numerous statins for hyperlipidemia (including simvastatin, pravastatin, rosuvastatin) and the only one he has tolerated is Livalo.  The patient specifically denies any chest pain at rest exertion, dyspnea at rest or with exertion, orthopnea, paroxysmal nocturnal dyspnea, syncope, palpitations, focal neurological deficits, intermittent claudication, lower extremity edema, unexplained weight gain, cough, hemoptysis or wheezing.   Past Medical History:  Diagnosis Date  . Cancer ([place])   . GERD (gastroesophageal reflux disease)   . Hyperlipidemia     Past Surgical History:  Procedure Laterality Date  . ATRIAL FIBRILLATION ABLATION N/A 09/07/2016   Procedure: Atrial Fibrillation Ablation;  Surgeon: Constance Haw, MD;  Location: [place] [facility];  Service: Cardiovascular;  Laterality: N/A;  . CARDIOVERSION N/A 07/23/2016    Procedure: CARDIOVERSION;  Surgeon: Sanda Klein, MD;  Location: [facility];  Service: Cardiovascular;  Laterality: N/A;  . CARDIOVERSION N/A 10/03/2016   Procedure: CARDIOVERSION;  Surgeon: Josue Hector, MD;  Location: [facility] ENDOSCOPY;  Service: Cardiovascular;  Laterality: N/A;    Current Medications: No outpatient medications have been marked as taking for the 12/23/18 encounter (Office Visit) with Sanda Klein, MD.     Allergies:   Patient has no known allergies.   Social History   Socioeconomic History  . Marital status: Married    Spouse name: Not on file  . Number of children: Not on file  . Years of education: Not on file  . Highest education level: Not on file  Occupational History  . Not on file  Tobacco Use  . Smoking status: Never Smoker  . Smokeless tobacco: Never Used  Substance and Sexual Activity  . Alcohol use: Yes    Comment: occasionally  . Drug use: No  . Sexual activity: Not on file  Other Topics Concern  . Not on file  Social History Narrative  . Not on file   Social Determinants of Health   Financial Resource Strain:   . Difficulty of Paying Living Expenses: Not on file  Food Insecurity:   . Worried About Charity fundraiser in the Last Year: Not on file  . Ran Out of Food in the Last Year: Not on file  Transportation Needs:   . Lack of Transportation (Medical): Not on file  . Lack of Transportation (Non-Medical): Not on file  Physical Activity:   . Days of Exercise per Week: Not on file  . Minutes of Exercise per Session:  Not on file  Stress:   . Feeling of Stress : Not on file  Social Connections:   . Frequency of Communication with Friends and Family: Not on file  . Frequency of Social Gatherings with Friends and Family: Not on file  . Attends Religious Services: Not on file  . Active Member of Clubs or Organizations: Not on file  . Attends Archivist Meetings: Not on file  . Marital Status: Not on file     Family  History: The patient's family history includes Alzheimer's disease in his sister; Heart Problems in his brother, brother, and father; Stroke in his brother. ROS:   Please see the history of present illness.    All other systems are reviewed and are negative.   EKGs/Labs/Other Studies Reviewed:    The following studies were reviewed today: Echocardiogram 09/09/2017 - Left ventricle: The cavity size was normal. Systolic function was   normal. The estimated ejection fraction was in the range of 55%   to 60%. Wall motion was normal; there were no regional wall   motion abnormalities. Features are consistent with a pseudonormal   left ventricular filling pattern, with concomitant abnormal   relaxation and increased filling pressure (grade 2 diastolic   dysfunction). Doppler parameters are consistent with   indeterminate ventricular filling pressure. - Aortic valve: Transvalvular velocity was within the normal range.   There was no stenosis. There was no regurgitation. - Mitral valve: Transvalvular velocity was within the normal range.   There was no evidence for stenosis. There was trivial   regurgitation. - Right ventricle: The cavity size was normal. Wall thickness was   normal. Systolic function was normal. - Tricuspid valve: There was no regurgitation. Radiofrequency ablation report and cardioversion report from 2018  EKG:  EKG is  ordered today.  It shows sinus rhythm with occasional PVCs, left anterior fascicular block, no repolarization abnormalities, QTC 427 ms.  The ekg ordered today demonstrates atrial fibrillation with ventricular rate 97 bpm at rest. There is poor R-wave progression with transition in V5 and left axis deviation, not meeting criteria for left anterior fascicular block. Normal QTC 447 ms. No clear repolarization abnormalities are seen.  Recent Labs: 05/30/2016 hemoglobin 15.4, glucose 126, BUN 18, creatinine 1.1, potassium 3.8, normal liver function tests, TSH  2.6 Hemoglobin A1c 5.8%  Total cholesterol 190, triglycerides 132, HDL 46, LDL 117  Lipid Panel     Component Value Date/Time   CHOL 182 09/13/2017 0740   TRIG 109 09/13/2017 0740   HDL 45 09/13/2017 0740   CHOLHDL 4.0 09/13/2017 0740   LDLCALC 115 (H) 09/13/2017 0740   LABVLDL 22 09/13/2017 0740     Physical Exam:    VS:  BP 129/83   Pulse 66   Ht 5\' 8"  (1.727 m)   Wt 251 lb (113.9 kg)   SpO2 99%   BMI 38.16 kg/m     Wt Readings from Last 3 Encounters:  12/23/18 251 lb (113.9 kg)  09/12/17 244 lb 9.6 oz (110.9 kg)  03/11/17 248 lb (112.5 kg)     GEN:  Well nourished, well developed in no acute distress HEENT: Normal NECK: No JVD; No carotid bruits LYMPHATICS: No lymphadenopathy CARDIAC: irregular, no murmurs, rubs, gallops RESPIRATORY:  Clear to auscultation without rales, wheezing or rhonchi  ABDOMEN: Soft, non-tender, non-distended MUSCULOSKELETAL:  No edema; No deformity  SKIN: Warm and dry NEUROLOGIC:  Alert and oriented x 3 PSYCHIATRIC:  Normal affect   ASSESSMENT:  1. Persistent atrial fibrillation ([place])   2. Nonischemic cardiomyopathy ([place])   3. Hypercholesterolemia   4. Class 2 severe obesity with serious comorbidity and body mass index (BMI) of 38.0 to 38.9 in adult, unspecified obesity type ([place])    PLAN:    In order of problems listed above:  1. AFib: Clinically his had an excellent response to ablation.  He is no longer taking anticoagulants.  We discussed the fact that atrial fibrillation tends to be a recurrent problem, even after successful ablation.  At this point CHA2DS2-VASc score is only 1 (age).  He should promptly report any focal neurological events.  Would "qualify" for full anticoagulation after age 28. 2. 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 3. HLP: LDL is not quite at target but made the best we can achieve without expensive  brand medications.  He has been intolerant of other statins but seems to be doing okay on current dose of Livalo.  4. Severe obesity: Weight loss be highly beneficial.  Reviewed the impact of weight on the burden of atrial fibrillation, development of diabetes, adverse lipid profile and vascular complications.   Medication Adjustments/Labs and Tests Ordered: Current medicines are reviewed at length with the patient today.  Concerns regarding medicines are outlined above. Labs and tests ordered and medication changes are outlined in the patient instructions below:  Patient Instructions  Medication Instructions:  No changes *If you need a refill on your cardiac medications before your next appointment, please call your pharmacy*  Lab Work: None ordered If you have labs (blood work) drawn today and your tests are completely normal, you will receive your results only by: [name] MyChart Message (if you have MyChart) OR . A paper copy in the mail If you have any lab test that is abnormal or we need to change your treatment, we will call you to review the results.  Testing/Procedures: None ordered  Follow-Up: At [facility], you and your health needs are our priority.  As part of our continuing mission to provide you with exceptional heart care, we have created designated Provider Care Teams.  These Care Teams include your primary Cardiologist (physician) and Advanced Practice Providers (APPs -  Physician Assistants and Nurse Practitioners) who all work together to provide you with the care you need, when you need it.  Your next appointment:   12 month(s)  The format for your next appointment:   In Person  Provider:   Sanda Klein, MD     Signed, Sanda Klein, MD  12/28/2018 10:21 AM    [place]

## 2018-12-28 ENCOUNTER — Encounter: Payer: Self-pay | Admitting: Cardiovascular Disease

## 2019-03-12 ENCOUNTER — Ambulatory Visit: Payer: Medicare Other | Attending: Internal Medicine

## 2019-03-12 DIAGNOSIS — Z23 Encounter for immunization: Secondary | ICD-10-CM | POA: Insufficient documentation

## 2019-03-12 NOTE — Progress Notes (Signed)
   Covid-19 Vaccination Clinic  Name:  JAICION CAHN    MRN: [id number] DOB: Oct 30, 1950  03/12/2019  Mr. Waver was observed post Covid-19 immunization for 15 minutes without incidence. He was provided with Vaccine Information Sheet and instruction to access the V-Safe system.   Mr. Hopper was instructed to call 911 with any severe reactions post vaccine: [name] Difficulty breathing  . Swelling of your face and throat  . A fast heartbeat  . A bad rash all over your body  . Dizziness and weakness    Immunizations Administered    Name Date Dose VIS Date Route   Pfizer COVID-19 Vaccine 03/12/2019  2:20 PM 0.3 mL 12/26/2018 Intramuscular   Manufacturer: [place]   Lot: [id number]   [place]: [id number]

## 2019-03-24 DIAGNOSIS — K429 Umbilical hernia without obstruction or gangrene: Secondary | ICD-10-CM | POA: Diagnosis not present

## 2019-03-24 DIAGNOSIS — E669 Obesity, unspecified: Secondary | ICD-10-CM | POA: Diagnosis not present

## 2019-03-24 DIAGNOSIS — L821 Other seborrheic keratosis: Secondary | ICD-10-CM | POA: Diagnosis not present

## 2019-03-24 DIAGNOSIS — K4090 Unilateral inguinal hernia, without obstruction or gangrene, not specified as recurrent: Secondary | ICD-10-CM | POA: Diagnosis not present

## 2019-04-06 DIAGNOSIS — Z0000 Encounter for general adult medical examination without abnormal findings: Secondary | ICD-10-CM | POA: Diagnosis not present

## 2019-04-06 DIAGNOSIS — I4891 Unspecified atrial fibrillation: Secondary | ICD-10-CM | POA: Diagnosis not present

## 2019-04-06 DIAGNOSIS — E669 Obesity, unspecified: Secondary | ICD-10-CM | POA: Diagnosis not present

## 2019-04-06 DIAGNOSIS — G4700 Insomnia, unspecified: Secondary | ICD-10-CM | POA: Diagnosis not present

## 2019-04-06 DIAGNOSIS — E7800 Pure hypercholesterolemia, unspecified: Secondary | ICD-10-CM | POA: Diagnosis not present

## 2019-04-06 DIAGNOSIS — K219 Gastro-esophageal reflux disease without esophagitis: Secondary | ICD-10-CM | POA: Diagnosis not present

## 2019-04-06 DIAGNOSIS — Z125 Encounter for screening for malignant neoplasm of prostate: Secondary | ICD-10-CM | POA: Diagnosis not present

## 2019-04-06 DIAGNOSIS — R7309 Other abnormal glucose: Secondary | ICD-10-CM | POA: Diagnosis not present

## 2019-04-07 ENCOUNTER — Ambulatory Visit: Payer: Medicare Other | Attending: Internal Medicine

## 2019-04-07 DIAGNOSIS — Z23 Encounter for immunization: Secondary | ICD-10-CM

## 2019-04-07 NOTE — Progress Notes (Signed)
   Covid-19 Vaccination Clinic  Name:  [name] [name]    MRN: [id number] DOB: 06-Apr-1950  04/07/2019  [name] was observed post Covid-19 immunization for 15 minutes without incident. He was provided with Vaccine Information Sheet and instruction to access the V-Safe system.   [name] was instructed to call 911 with any severe reactions post vaccine: [name] Difficulty breathing  . Swelling of face and throat  . A fast heartbeat  . A bad rash all over body  . Dizziness and weakness   Immunizations Administered    Name Date Dose VIS Date Route   Pfizer COVID-19 Vaccine 04/07/2019 11:01 AM 0.3 mL 12/26/2018 Intramuscular   Manufacturer: [place]   Lot: [id number]   [place]: [id number]

## 2019-05-22 DIAGNOSIS — K4020 Bilateral inguinal hernia, without obstruction or gangrene, not specified as recurrent: Secondary | ICD-10-CM | POA: Diagnosis not present

## 2019-05-22 DIAGNOSIS — K429 Umbilical hernia without obstruction or gangrene: Secondary | ICD-10-CM | POA: Diagnosis not present

## 2019-09-01 DIAGNOSIS — R42 Dizziness and giddiness: Secondary | ICD-10-CM | POA: Diagnosis not present

## 2019-09-01 DIAGNOSIS — H9190 Unspecified hearing loss, unspecified ear: Secondary | ICD-10-CM | POA: Diagnosis not present

## 2019-10-02 ENCOUNTER — Ambulatory Visit: Payer: Self-pay | Admitting: General Surgery

## 2019-10-02 DIAGNOSIS — H903 Sensorineural hearing loss, bilateral: Secondary | ICD-10-CM | POA: Diagnosis not present

## 2019-10-02 DIAGNOSIS — H9311 Tinnitus, right ear: Secondary | ICD-10-CM | POA: Diagnosis not present

## 2019-10-02 DIAGNOSIS — H905 Unspecified sensorineural hearing loss: Secondary | ICD-10-CM | POA: Diagnosis not present

## 2019-10-16 ENCOUNTER — Ambulatory Visit
Admission: RE | Admit: 2019-10-16 | Discharge: 2019-10-16 | Disposition: A | Discharge status: Home / Self Care | Payer: Medicare Other | Source: Ambulatory Visit | Attending: Family Medicine | Admitting: Family Medicine

## 2019-10-16 ENCOUNTER — Other Ambulatory Visit: Payer: Self-pay | Admitting: Family Medicine

## 2019-10-16 DIAGNOSIS — R059 Cough, unspecified: Secondary | ICD-10-CM | POA: Diagnosis not present

## 2019-10-16 DIAGNOSIS — J069 Acute upper respiratory infection, unspecified: Secondary | ICD-10-CM | POA: Diagnosis not present

## 2019-10-16 DIAGNOSIS — Z7189 Other specified counseling: Secondary | ICD-10-CM | POA: Diagnosis not present

## 2019-10-19 ENCOUNTER — Other Ambulatory Visit (HOSPITAL_COMMUNITY)
Admission: RE | Admit: 2019-10-19 | Discharge: 2019-10-19 | Disposition: A | Discharge status: Home / Self Care | Payer: Medicare Other | Source: Ambulatory Visit | Attending: General Surgery | Admitting: General Surgery

## 2019-10-19 ENCOUNTER — Encounter (HOSPITAL_BASED_OUTPATIENT_CLINIC_OR_DEPARTMENT_OTHER): Payer: Self-pay | Admitting: General Surgery

## 2019-10-19 ENCOUNTER — Other Ambulatory Visit: Payer: Self-pay

## 2019-10-19 DIAGNOSIS — Z20822 Contact with and (suspected) exposure to covid-19: Secondary | ICD-10-CM | POA: Diagnosis not present

## 2019-10-19 DIAGNOSIS — Z01812 Encounter for preprocedural laboratory examination: Secondary | ICD-10-CM | POA: Diagnosis not present

## 2019-10-19 LAB — SARS CORONAVIRUS 2 (TAT 6-24 HRS): SARS Coronavirus 2: NEGATIVE

## 2019-10-19 NOTE — Progress Notes (Signed)
Spoke w/ via phone for pre-op interview---pt Lab needs dos----   I stat 8            Lab results------ekg 12-23-2018 epic, lov dr croitoru 12-23-2018 epic COVID test ------10-19-19 930 am Arrive at -------530 am 10-22-19 NPO after MN NO Solid Food.  Clear liquids from MN until---430 am  Medications to take morning of surgery -----omeprazole, lovastatin, allergy relief Diabetic medication -----n/a Patient Special Instructions -----none Pre-Op special Istructions -----none Patient verbalized understanding of instructions that were given at this phone interview. Patient denies shortness of breath, chest pain, fever, cough at this phone interview.

## 2019-10-21 NOTE — Anesthesia Preprocedure Evaluation (Addendum)
Anesthesia Evaluation  Patient identified by MRN, date of birth, ID band Patient awake    Reviewed: Allergy & Precautions, NPO status , Patient's Chart, lab work & pertinent test results  History of Anesthesia Complications Negative for: history of anesthetic complications  Airway Mallampati: II  TM Distance: >3 FB Neck ROM: Full    Dental  (+) Dental Advisory Given, Teeth Intact   Pulmonary neg pulmonary ROS,    Pulmonary exam normal        Cardiovascular Normal cardiovascular exam+ dysrhythmias (s/p ablation) Atrial Fibrillation    '19 TTE - EF  55% to 60%. Grade 2 diastolic dysfunction. Trivial MR.   Neuro/Psych negative neurological ROS  negative psych ROS   GI/Hepatic Neg liver ROS, GERD  Medicated and Controlled,  Endo/Other   Obesity   Renal/GU negative Renal ROS     Musculoskeletal negative musculoskeletal ROS (+)   Abdominal   Peds  Hematology negative hematology ROS (+)   Anesthesia Other Findings Covid test negative   Reproductive/Obstetrics                            Anesthesia Physical Anesthesia Plan  ASA: II  Anesthesia Plan: General   Post-op Pain Management:    Induction: Intravenous  PONV Risk Score and Plan: 3 and Treatment may vary due to age or medical condition, Ondansetron, Dexamethasone and Midazolam  Airway Management Planned: Oral ETT  Additional Equipment: None  Intra-op Plan:   Post-operative Plan: Extubation in OR  Informed Consent: I have reviewed the patients History and Physical, chart, labs and discussed the procedure including the risks, benefits and alternatives for the proposed anesthesia with the patient or authorized representative who has indicated his/her understanding and acceptance.     Dental advisory given  Plan Discussed with: CRNA and Anesthesiologist  Anesthesia Plan Comments:        Anesthesia Quick Evaluation

## 2019-10-22 ENCOUNTER — Ambulatory Visit (HOSPITAL_BASED_OUTPATIENT_CLINIC_OR_DEPARTMENT_OTHER): Payer: Medicare Other | Admitting: Anesthesiology

## 2019-10-22 ENCOUNTER — Ambulatory Visit (HOSPITAL_BASED_OUTPATIENT_CLINIC_OR_DEPARTMENT_OTHER)
Admission: RE | Admit: 2019-10-22 | Discharge: 2019-10-22 | Disposition: A | Discharge status: Home / Self Care | Payer: Medicare Other | Source: Ambulatory Visit | Attending: General Surgery | Admitting: General Surgery

## 2019-10-22 ENCOUNTER — Encounter (HOSPITAL_BASED_OUTPATIENT_CLINIC_OR_DEPARTMENT_OTHER): Payer: Self-pay | Admitting: General Surgery

## 2019-10-22 ENCOUNTER — Encounter (HOSPITAL_BASED_OUTPATIENT_CLINIC_OR_DEPARTMENT_OTHER)
Admission: RE | Disposition: A | Discharge status: Home / Self Care | Payer: Self-pay | Source: Ambulatory Visit | Attending: General Surgery

## 2019-10-22 ENCOUNTER — Other Ambulatory Visit: Payer: Self-pay

## 2019-10-22 DIAGNOSIS — Z6836 Body mass index (BMI) 36.0-36.9, adult: Secondary | ICD-10-CM | POA: Insufficient documentation

## 2019-10-22 DIAGNOSIS — Z820 Family history of epilepsy and other diseases of the nervous system: Secondary | ICD-10-CM | POA: Insufficient documentation

## 2019-10-22 DIAGNOSIS — K219 Gastro-esophageal reflux disease without esophagitis: Secondary | ICD-10-CM | POA: Insufficient documentation

## 2019-10-22 DIAGNOSIS — Z8249 Family history of ischemic heart disease and other diseases of the circulatory system: Secondary | ICD-10-CM | POA: Insufficient documentation

## 2019-10-22 DIAGNOSIS — K4020 Bilateral inguinal hernia, without obstruction or gangrene, not specified as recurrent: Secondary | ICD-10-CM | POA: Diagnosis not present

## 2019-10-22 DIAGNOSIS — E785 Hyperlipidemia, unspecified: Secondary | ICD-10-CM | POA: Insufficient documentation

## 2019-10-22 DIAGNOSIS — K429 Umbilical hernia without obstruction or gangrene: Secondary | ICD-10-CM | POA: Insufficient documentation

## 2019-10-22 DIAGNOSIS — E7800 Pure hypercholesterolemia, unspecified: Secondary | ICD-10-CM | POA: Diagnosis not present

## 2019-10-22 DIAGNOSIS — I428 Other cardiomyopathies: Secondary | ICD-10-CM | POA: Diagnosis not present

## 2019-10-22 DIAGNOSIS — I4891 Unspecified atrial fibrillation: Secondary | ICD-10-CM | POA: Insufficient documentation

## 2019-10-22 DIAGNOSIS — Z85820 Personal history of malignant melanoma of skin: Secondary | ICD-10-CM | POA: Insufficient documentation

## 2019-10-22 DIAGNOSIS — Z7982 Long term (current) use of aspirin: Secondary | ICD-10-CM | POA: Diagnosis not present

## 2019-10-22 DIAGNOSIS — E669 Obesity, unspecified: Secondary | ICD-10-CM | POA: Diagnosis not present

## 2019-10-22 DIAGNOSIS — Z79899 Other long term (current) drug therapy: Secondary | ICD-10-CM | POA: Diagnosis not present

## 2019-10-22 DIAGNOSIS — Z823 Family history of stroke: Secondary | ICD-10-CM | POA: Insufficient documentation

## 2019-10-22 HISTORY — PX: UMBILICAL HERNIA REPAIR: SHX196

## 2019-10-22 HISTORY — DX: Changes in skin texture: R23.4

## 2019-10-22 HISTORY — DX: Other cardiomyopathies: I42.8

## 2019-10-22 HISTORY — PX: INGUINAL HERNIA REPAIR: SHX194

## 2019-10-22 HISTORY — DX: Umbilical hernia without obstruction or gangrene: K42.9

## 2019-10-22 LAB — POCT I-STAT, CHEM 8
BUN: 15 mg/dL (ref 8–23)
Calcium, Ion: 1.18 mmol/L (ref 1.15–1.40)
Chloride: 101 mmol/L (ref 98–111)
Creatinine, Ser: 0.9 mg/dL (ref 0.61–1.24)
Glucose, Bld: 104 mg/dL — ABNORMAL HIGH (ref 70–99)
HCT: 45 % (ref 39.0–52.0)
Hemoglobin: 15.3 g/dL (ref 13.0–17.0)
Potassium: 4.4 mmol/L (ref 3.5–5.1)
Sodium: 139 mmol/L (ref 135–145)
TCO2: 26 mmol/L (ref 22–32)

## 2019-10-22 SURGERY — REPAIR, HERNIA, UMBILICAL, ADULT
Anesthesia: General | Site: Groin

## 2019-10-22 MED ORDER — DEXAMETHASONE SODIUM PHOSPHATE 10 MG/ML IJ SOLN
INTRAMUSCULAR | Status: AC
  Filled 2019-10-22: qty 1

## 2019-10-22 MED ORDER — KETOROLAC TROMETHAMINE 15 MG/ML IJ SOLN: 15.0000 mg | INTRAMUSCULAR | Status: DC

## 2019-10-22 MED ORDER — LIDOCAINE HCL (CARDIAC) PF 100 MG/5ML IV SOSY
PREFILLED_SYRINGE | INTRAVENOUS | Status: DC | PRN
  Administered 2019-10-22: 80 mg via INTRAVENOUS

## 2019-10-22 MED ORDER — ONDANSETRON HCL 4 MG/2ML IJ SOLN
INTRAMUSCULAR | Status: DC | PRN
  Administered 2019-10-22: 4 mg via INTRAVENOUS

## 2019-10-22 MED ORDER — PROPOFOL 10 MG/ML IV BOLUS
INTRAVENOUS | Status: DC | PRN
  Administered 2019-10-22: 50 mg via INTRAVENOUS
  Administered 2019-10-22: 160 mg via INTRAVENOUS

## 2019-10-22 MED ORDER — ACETAMINOPHEN 500 MG PO TABS
1000.0000 mg | ORAL_TABLET | ORAL | Status: AC
  Administered 2019-10-22: 1000 mg via ORAL

## 2019-10-22 MED ORDER — BUPIVACAINE LIPOSOME 1.3 % IJ SUSP: 20.0000 mL | Freq: Once | INTRAMUSCULAR | Status: DC

## 2019-10-22 MED ORDER — CHLORHEXIDINE GLUCONATE CLOTH 2 % EX PADS: 6.0000 | MEDICATED_PAD | Freq: Once | CUTANEOUS | Status: DC

## 2019-10-22 MED ORDER — BUPIVACAINE HCL (PF) 0.5 % IJ SOLN
INTRAMUSCULAR | Status: AC
  Filled 2019-10-22: qty 30

## 2019-10-22 MED ORDER — ONDANSETRON HCL 4 MG/2ML IJ SOLN
INTRAMUSCULAR | Status: AC
  Filled 2019-10-22: qty 2

## 2019-10-22 MED ORDER — FENTANYL CITRATE (PF) 100 MCG/2ML IJ SOLN
INTRAMUSCULAR | Status: AC
  Filled 2019-10-22: qty 2

## 2019-10-22 MED ORDER — GLYCOPYRROLATE PF 0.2 MG/ML IJ SOSY
PREFILLED_SYRINGE | INTRAMUSCULAR | Status: AC
  Filled 2019-10-22: qty 1

## 2019-10-22 MED ORDER — BUPIVACAINE LIPOSOME 1.3 % IJ SUSP
INTRAMUSCULAR | Status: AC
  Filled 2019-10-22: qty 20

## 2019-10-22 MED ORDER — CEFAZOLIN SODIUM-DEXTROSE 2-4 GM/100ML-% IV SOLN
2.0000 g | INTRAVENOUS | Status: AC
  Administered 2019-10-22: 2 g via INTRAVENOUS

## 2019-10-22 MED ORDER — BUPIVACAINE LIPOSOME 1.3 % IJ SUSP
INTRAMUSCULAR | Status: DC | PRN
  Administered 2019-10-22: 50 mL

## 2019-10-22 MED ORDER — OXYCODONE HCL 5 MG/5ML PO SOLN: 5.0000 mg | Freq: Once | ORAL | Status: DC | PRN

## 2019-10-22 MED ORDER — OXYCODONE HCL 5 MG PO TABS: 5.0000 mg | ORAL_TABLET | Freq: Once | ORAL | Status: DC | PRN

## 2019-10-22 MED ORDER — LACTATED RINGERS IV SOLN: INTRAVENOUS | Status: DC

## 2019-10-22 MED ORDER — ROCURONIUM BROMIDE 100 MG/10ML IV SOLN
INTRAVENOUS | Status: DC | PRN
  Administered 2019-10-22: 60 mg via INTRAVENOUS
  Administered 2019-10-22 (×3): 10 mg via INTRAVENOUS

## 2019-10-22 MED ORDER — ACETAMINOPHEN 500 MG PO TABS
ORAL_TABLET | ORAL | Status: AC
  Filled 2019-10-22: qty 2

## 2019-10-22 MED ORDER — ONDANSETRON HCL 4 MG/2ML IJ SOLN: 4.0000 mg | Freq: Once | INTRAMUSCULAR | Status: DC | PRN

## 2019-10-22 MED ORDER — KETOROLAC TROMETHAMINE 30 MG/ML IJ SOLN
INTRAMUSCULAR | Status: DC | PRN
  Administered 2019-10-22: 30 mg via INTRAVENOUS

## 2019-10-22 MED ORDER — CEFAZOLIN SODIUM-DEXTROSE 2-4 GM/100ML-% IV SOLN
INTRAVENOUS | Status: AC
  Filled 2019-10-22: qty 100

## 2019-10-22 MED ORDER — OXYCODONE HCL 5 MG PO TABS: 5.0000 mg | ORAL_TABLET | Freq: Four times a day (QID) | ORAL | 0 refills | Status: DC | PRN

## 2019-10-22 MED ORDER — MIDAZOLAM HCL 5 MG/5ML IJ SOLN
INTRAMUSCULAR | Status: DC | PRN
  Administered 2019-10-22: 2 mg via INTRAVENOUS

## 2019-10-22 MED ORDER — LIDOCAINE 2% (20 MG/ML) 5 ML SYRINGE
INTRAMUSCULAR | Status: AC
  Filled 2019-10-22: qty 5

## 2019-10-22 MED ORDER — DEXAMETHASONE SODIUM PHOSPHATE 4 MG/ML IJ SOLN
INTRAMUSCULAR | Status: DC | PRN
  Administered 2019-10-22: 10 mg via INTRAVENOUS

## 2019-10-22 MED ORDER — FENTANYL CITRATE (PF) 250 MCG/5ML IJ SOLN
INTRAMUSCULAR | Status: AC
  Filled 2019-10-22: qty 5

## 2019-10-22 MED ORDER — FENTANYL CITRATE (PF) 100 MCG/2ML IJ SOLN: 25.0000 ug | INTRAMUSCULAR | Status: DC | PRN

## 2019-10-22 MED ORDER — MIDAZOLAM HCL 2 MG/2ML IJ SOLN
INTRAMUSCULAR | Status: AC
  Filled 2019-10-22: qty 2

## 2019-10-22 MED ORDER — FENTANYL CITRATE (PF) 100 MCG/2ML IJ SOLN
INTRAMUSCULAR | Status: DC | PRN
  Administered 2019-10-22 (×7): 50 ug via INTRAVENOUS

## 2019-10-22 MED ORDER — ENSURE PRE-SURGERY PO LIQD: 296.0000 mL | Freq: Once | ORAL | Status: DC

## 2019-10-22 MED ORDER — GLYCOPYRROLATE 0.2 MG/ML IJ SOLN
INTRAMUSCULAR | Status: DC | PRN
  Administered 2019-10-22: .2 mg via INTRAVENOUS

## 2019-10-22 MED ORDER — PROPOFOL 10 MG/ML IV BOLUS
INTRAVENOUS | Status: AC
  Filled 2019-10-22: qty 40

## 2019-10-22 MED ORDER — IBUPROFEN 800 MG PO TABS: 800.0000 mg | ORAL_TABLET | Freq: Three times a day (TID) | ORAL | 0 refills | Status: DC | PRN

## 2019-10-22 MED ORDER — SUGAMMADEX SODIUM 200 MG/2ML IV SOLN
INTRAVENOUS | Status: DC | PRN
  Administered 2019-10-22: 200 mg via INTRAVENOUS

## 2019-10-22 MED ORDER — ROCURONIUM BROMIDE 10 MG/ML (PF) SYRINGE
PREFILLED_SYRINGE | INTRAVENOUS | Status: AC
  Filled 2019-10-22: qty 10

## 2019-10-22 SURGICAL SUPPLY — 68 items
ADH SKN CLS APL DERMABOND .7 (GAUZE/BANDAGES/DRESSINGS) ×2
APL PRP STRL LF DISP 70% ISPRP (MISCELLANEOUS) ×2
BLADE CLIPPER SENSICLIP SURGIC (BLADE) ×3 IMPLANT
BLADE HEX COATED 2.75 (ELECTRODE) ×3 IMPLANT
BLADE SURG 15 STRL LF DISP TIS (BLADE) ×2 IMPLANT
BLADE SURG 15 STRL SS (BLADE) ×3
CABLE HIGH FREQUENCY MONO STRZ (ELECTRODE) ×3 IMPLANT
CANISTER SUCT 3000ML PPV (MISCELLANEOUS) ×1 IMPLANT
CELLS DAT CNTRL 66122 CELL SVR (MISCELLANEOUS) IMPLANT
CHLORAPREP W/TINT 26 (MISCELLANEOUS) ×3 IMPLANT
COVER BACK TABLE 60X90IN (DRAPES) ×2 IMPLANT
COVER MAYO STAND STRL (DRAPES) ×3 IMPLANT
COVER WAND RF STERILE (DRAPES) ×3 IMPLANT
DECANTER SPIKE VIAL GLASS SM (MISCELLANEOUS) ×2 IMPLANT
DERMABOND ADVANCED (GAUZE/BANDAGES/DRESSINGS) ×1
DERMABOND ADVANCED .7 DNX12 (GAUZE/BANDAGES/DRESSINGS) ×2 IMPLANT
DEVICE SECURE STRAP 25 ABSORB (INSTRUMENTS) IMPLANT
DRAPE LAPAROSCOPIC ABDOMINAL (DRAPES) ×3 IMPLANT
DRAPE UTILITY XL STRL (DRAPES) ×3 IMPLANT
DRSG COVADERM PLUS 2X2 (GAUZE/BANDAGES/DRESSINGS) IMPLANT
ELECT REM PT RETURN 9FT ADLT (ELECTROSURGICAL) ×3
ELECTRODE REM PT RTRN 9FT ADLT (ELECTROSURGICAL) ×2 IMPLANT
GLOVE BIO SURGEON STRL SZ 6.5 (GLOVE) ×6 IMPLANT
GLOVE BIOGEL PI IND STRL 6.5 (GLOVE) ×3 IMPLANT
GLOVE BIOGEL PI IND STRL 7.0 (GLOVE) ×2 IMPLANT
GLOVE BIOGEL PI INDICATOR 6.5 (GLOVE) ×3
GLOVE BIOGEL PI INDICATOR 7.0 (GLOVE) ×2
GLOVE SURG SS PI 7.0 STRL IVOR (GLOVE) ×3 IMPLANT
GOWN STRL REUS W/TWL LRG LVL3 (GOWN DISPOSABLE) ×10 IMPLANT
GRASPER SUT TROCAR 14GX15 (MISCELLANEOUS) ×2 IMPLANT
IRRIG SUCT STRYKERFLOW 2 WTIP (MISCELLANEOUS) ×3
IRRIGATION SUCT STRKRFLW 2 WTP (MISCELLANEOUS) ×2 IMPLANT
KIT TURNOVER CYSTO (KITS) ×3 IMPLANT
MESH 3DMAX 4X6 LT LRG (Mesh General) ×1 IMPLANT
MESH 3DMAX 4X6 RT LRG (Mesh General) ×2 IMPLANT
NDL HYPO 25X1 1.5 SAFETY (NEEDLE) ×2 IMPLANT
NEEDLE HYPO 25X1 1.5 SAFETY (NEEDLE) ×3 IMPLANT
NS IRRIG 500ML POUR BTL (IV SOLUTION) ×3 IMPLANT
PACK BASIN DAY SURGERY FS (CUSTOM PROCEDURE TRAY) ×3 IMPLANT
PENCIL SMOKE EVACUATOR (MISCELLANEOUS) ×3 IMPLANT
RETRACTOR WND ALEXIS 18 MED (MISCELLANEOUS) IMPLANT
RTRCTR WOUND ALEXIS 18CM MED (MISCELLANEOUS)
RTRCTR WOUND ALEXIS 18CM SML (INSTRUMENTS)
SAVER CELL AAL HAEMONETICS (INSTRUMENTS) IMPLANT
SCISSORS LAP 5X35 DISP (ENDOMECHANICALS) IMPLANT
SET TUBE SMOKE EVAC HIGH FLOW (TUBING) ×3 IMPLANT
SHEARS HARMONIC ACE PLUS 36CM (ENDOMECHANICALS) ×1 IMPLANT
SPONGE LAP 4X18 RFD (DISPOSABLE) ×3 IMPLANT
SUT MNCRL AB 4-0 PS2 18 (SUTURE) ×3 IMPLANT
SUT NOVA NAB GS-21 0 18 T12 DT (SUTURE) ×3 IMPLANT
SUT PDS AB 0 CT1 36 (SUTURE) IMPLANT
SUT PROLENE 0 CT 1 CR/8 (SUTURE) IMPLANT
SUT VIC AB 0 SH 27 (SUTURE) IMPLANT
SUT VIC AB 2-0 SH 27 (SUTURE)
SUT VIC AB 2-0 SH 27XBRD (SUTURE) IMPLANT
SUT VIC AB 3-0 SH 27 (SUTURE) ×3
SUT VIC AB 3-0 SH 27X BRD (SUTURE) ×2 IMPLANT
SUT VICRYL 0 UR6 27IN ABS (SUTURE) IMPLANT
SUT VLOC BARB 180 ABS3/0GR12 (SUTURE)
SUTURE VLOC BRB 180 ABS3/0GR12 (SUTURE) IMPLANT
SYR BULB IRRIG 60ML STRL (SYRINGE) ×3 IMPLANT
SYR CONTROL 10ML LL (SYRINGE) ×3 IMPLANT
TOWEL OR 17X26 10 PK STRL BLUE (TOWEL DISPOSABLE) ×5 IMPLANT
TRAY LAPAROSCOPIC (CUSTOM PROCEDURE TRAY) ×3 IMPLANT
TROCAR BLADELESS OPT 12M 100M (ENDOMECHANICALS) ×3 IMPLANT
TROCAR BLADELESS OPT 5 100 (ENDOMECHANICALS) ×6 IMPLANT
TUBE CONNECTING 12X1/4 (SUCTIONS) ×3 IMPLANT
YANKAUER SUCT BULB TIP NO VENT (SUCTIONS) ×3 IMPLANT

## 2019-10-22 NOTE — Anesthesia Procedure Notes (Signed)
Procedure Name: Intubation Date/Time: 10/22/2019 7:49 AM Performed by: Justice Rocher, CRNA Pre-anesthesia Checklist: Patient identified, Emergency Drugs available, Suction available, Patient being monitored and Timeout performed Patient Re-evaluated:Patient Re-evaluated prior to induction Oxygen Delivery Method: Circle system utilized Preoxygenation: Pre-oxygenation with 100% oxygen Induction Type: IV induction Ventilation: Mask ventilation without difficulty Laryngoscope Size: Mac and 4 Grade View: Grade II Tube type: Oral Tube size: 8.0 mm Number of attempts: 1 Airway Equipment and Method: Stylet and Oral airway Placement Confirmation: ETT inserted through vocal cords under direct vision,  positive ETCO2,  breath sounds checked- equal and bilateral and CO2 detector Secured at: 23 cm Tube secured with: Tape Dental Injury: Teeth and Oropharynx as per pre-operative assessment

## 2019-10-22 NOTE — H&P (Signed)
[name] [name] is an 69 y.o. male.   Chief Complaint: hernias HPI: 69 yo male with small umbilical hernia and bilateral inguinal hernias. He continues to have intermittent abdominal pain related to the hernias and presents for surgery.  Past Medical History:  Diagnosis Date  . Cancer ([place]) 2015   melanoma removed from back  . GERD (gastroesophageal reflux disease)   . History of cardiac dysrhythmia 2018   ablation and cardioversion done  . Hyperlipidemia   . Nonischemic cardiomyopathy ([place])   . Scab    right elbow healing  . Umbilical hernia     Past Surgical History:  Procedure Laterality Date  . ATRIAL FIBRILLATION ABLATION N/A 09/07/2016   Procedure: Atrial Fibrillation Ablation;  Surgeon: Constance Haw, MD;  Location: [place] [facility];  Service: Cardiovascular;  Laterality: N/A;  . CARDIOVERSION N/A 07/23/2016   Procedure: CARDIOVERSION;  Surgeon: Sanda Klein, MD;  Location: [facility] ENDOSCOPY;  Service: Cardiovascular;  Laterality: N/A;  . CARDIOVERSION N/A 10/03/2016   Procedure: CARDIOVERSION;  Surgeon: Josue Hector, MD;  Location: [facility] ENDOSCOPY;  Service: Cardiovascular;  Laterality: N/A;  . HERNIA REPAIR  at birth    Family History  Problem Relation Age of Onset  . Heart Problems Father   . Alzheimer's disease Sister   . Stroke Brother   . Heart Problems Brother   . Heart Problems Brother    Social History:  reports that he has never smoked. He has never used smokeless tobacco. He reports current alcohol use. He reports that he does not use drugs.  Allergies: No Known Allergies  Medications Prior to Admission  Medication Sig Dispense Refill  . aspirin EC 81 MG tablet Take 81 mg by mouth daily. Swallow whole.    . Cholecalciferol (VITAMIN D3) 75 MCG (3000 UT) TABS Take by mouth. 50 mg daily    . lovastatin (MEVACOR) 10 MG tablet Take 10 mg by mouth daily.    . naproxen sodium (ALEVE) 220 MG tablet Take 220 mg by mouth. takes 2 tabs daily    . omeprazole  (PRILOSEC) 20 MG capsule Take 20 mg by mouth daily.    [name] OVER THE COUNTER MEDICATION Allergy relief dipenhydramine 25 mg in am    . UNABLE TO FIND Med Name: calcium 600 mg daily      Results for orders placed or performed during the hospital encounter of 10/22/19 (from the past 48 hour(s))  I-STAT, chem 8     Status: Abnormal   Collection Time: 10/22/19  6:28 AM  Result Value Ref Range   Sodium 139 135 - 145 mmol/L   Potassium 4.4 3.5 - 5.1 mmol/L   Chloride 101 98 - 111 mmol/L   BUN 15 8 - 23 mg/dL   Creatinine, Ser 0.90 0.61 - 1.24 mg/dL   Glucose, Bld 104 (H) 70 - 99 mg/dL    Comment: Glucose reference range applies only to samples taken after fasting for at least 8 hours.   Calcium, Ion 1.18 1.15 - 1.40 mmol/L   TCO2 26 22 - 32 mmol/L   Hemoglobin 15.3 13.0 - 17.0 g/dL   HCT 45.0 39 - 52 %   No results found.  Review of Systems  Constitutional: Negative for chills and fever.  HENT: Negative for hearing loss.   Respiratory: Negative for cough.   Cardiovascular: Negative for chest pain and palpitations.  Gastrointestinal: Negative for abdominal pain, nausea and vomiting.  Genitourinary: Negative for dysuria and urgency.  Musculoskeletal: Negative for  myalgias and neck pain.  Skin: Negative for rash.  Neurological: Negative for dizziness and headaches.  Hematological: Does not bruise/bleed easily.  Psychiatric/Behavioral: Negative for suicidal ideas.    Blood pressure 137/85, pulse 65, temperature 98.9 F (37.2 C), temperature source Oral, resp. rate 18, height 5\' 8"  (1.727 m), weight 109.4 kg, SpO2 97 %. Physical Exam Vitals reviewed.  Constitutional:      Appearance: He is well-developed.  HENT:     Head: Normocephalic and atraumatic.  Eyes:     Conjunctiva/sclera: Conjunctivae normal.     Pupils: Pupils are equal, round, and reactive to light.  Cardiovascular:     Rate and Rhythm: Normal rate and regular rhythm.  Pulmonary:     Effort: Pulmonary effort is  normal.     Breath sounds: Normal breath sounds.  Abdominal:     General: Bowel sounds are normal. There is no distension.     Palpations: Abdomen is soft.     Tenderness: There is no abdominal tenderness.     Comments: Umbilical hernia, bilateral inguinal hernia  Musculoskeletal:        General: Normal range of motion.     Cervical back: Normal range of motion and neck supple.  Skin:    General: Skin is warm and dry.  Neurological:     Mental Status: He is alert and oriented to person, place, and time.  Psychiatric:        Behavior: Behavior normal.     Assessment/Plan 70 yo male with umbilical hernia and bilateral inguinal hernias -lap bilateral inguinal hernia and open umbilical hernia -planned outpatient procedure  Mickeal Skinner, MD 10/22/2019, 7:20 AM

## 2019-10-22 NOTE — Discharge Instructions (Signed)
  Post Anesthesia Home Care Instructions  Activity: Get plenty of rest for the remainder of the day. A responsible individual must stay with you for 24 hours following the procedure.  For the next 24 hours, DO NOT: -Drive a car -Paediatric nurse -Drink alcoholic beverages -Take any medication unless instructed by your physician -Make any legal decisions or sign important papers.  Meals: Start with liquid foods such as gelatin or soup. Progress to regular foods as tolerated. Avoid greasy, spicy, heavy foods. If nausea and/or vomiting occur, drink only clear liquids until the nausea and/or vomiting subsides. Call your physician if vomiting continues.  Special Instructions/Symptoms: Your throat may feel dry or sore from the anesthesia or the breathing tube placed in your throat during surgery. If this causes discomfort, gargle with warm salt water. The discomfort should disappear within 24 hours.     Information for Discharge Teaching: EXPAREL (bupivacaine liposome injectable suspension)   Your surgeon or anesthesiologist gave you EXPAREL(bupivacaine) to help control your pain after surgery.   EXPAREL is a local anesthetic that provides pain relief by numbing the tissue around the surgical site.  EXPAREL is designed to release pain medication over time and can control pain for up to 72 hours.  Depending on how you respond to EXPAREL, you may require less pain medication during your recovery.  Possible side effects:  Temporary loss of sensation or ability to move in the area where bupivacaine was injected.  Nausea, vomiting, constipation  Rarely, numbness and tingling in your mouth or lips, lightheadedness, or anxiety may occur.  Call your doctor right away if you think you may be experiencing any of these sensations, or if you have other questions regarding possible side effects.  Follow all other discharge instructions given to you by your surgeon or nurse. Eat a healthy diet  and drink plenty of water or other fluids.  If you return to the hospital for any reason within 96 hours following the administration of EXPAREL, it is important for health care providers to know that you have received this anesthetic. A teal colored band has been placed on your arm with the date, time and amount of EXPAREL you have received in order to alert and inform your health care providers. Please leave this armband in place for the full 96 hours following administration, and then you may remove the band.

## 2019-10-22 NOTE — Op Note (Signed)
Preop diagnosis: bilateral inguinal hernia  Postop diagnosis: bilateral indirect inguinal hernia  Procedure: laparoscopic Bilateral inguinal hernia repair with mesh  Surgeon: Gurney Maxin, M.D.  Asst: Ahmed Prima, M.D.  Anesthesia: Gen.   Indications for procedure: [name] [name] is a 69 y.o. male with symptoms of pain and enlarging Bilateral inguinal hernia(s). After discussing risks, alternatives and benefits he decided on laparoscopic repair and was brought to day surgery for repair.  Description of procedure: The patient was brought into the operative suite, placed supine. Anesthesia was administered with endotracheal tube. Patient was strapped in place. The patient was prepped and draped in the usual sterile fashion.  Next Exparel:Marcaine mix was injected to the left of the umbilicus, and a transverse 2 cm incision was made. Dissection was used to visualize the anterior rectus sheath. Anterior rectus sheath was sharply incised, next to the 12 mm trocar was inserted into the preperitoneal space. Laparoscope was inserted and found that the peritoneum had a small hole. Therefore, decision was made to convert to a TAP type repair. 1 31mm trocar was placed in the left lower quadrant and 1 32mm trocar was placed in the right lower quadrant. Next the peritoneum was incised with harmonic scalpel and blunt dissection was used to create a peritoneal flap by freeing it away from the rectus muscles.  On initial visualization , There was a large left inguinal indirect hernia containing a loop of sigmoid colon and there was a moderate sized reduced indirect right inguinal hernia. The Exparel mix was infused along the transversus abdominis planes bilaterally. Next I began our dissection on the left identifying the ASIS laterally and then working back medially removing the filmy tissue and adhesions of the peritoneum to the abdominal wall. Hernia sac was completely dissected out of the canal. Vas  deference and contents of the cord were safely dissected away of the hernia sac.   Next, we turned our attention to the right side and bluntly dissected the peritoneum from the abdominal wall identifying the ASIS laterally and working back medially. The hernia sac was completely dissected out of the canal. The vas deference and contents of the cord were safely dissected away from the hernia sac.  A large right 3D max mesh was inserted and tacked medially to the lacunar ligament. The mesh was positioned flat and directly up against the direct and indirect areas. A large left 3D max mesh was inserted and tacked medially to the lacunar ligament. The 12 mm trocar site was closed with 0 Novafil by transfascial passer device. An additional 0 Novafil was placed at the umbilical site as there was a slight laxity.  The CO2 was evacuated while watching to ensure the mesh did not migrate. The anterior rectus fascia was closed with 0 vicryl in interrupted sutures and all skin incisions were closed with 4-0 monocryl subcu stitch. The patient awoke from anesthesia and was brought to PACU in stable condition.  Findings: bilateral indirect inguinal hernia, umbilical laxity without definite defect  Specimen: none  Blood loss: 30 ml  Local anesthesia: 50 ml Exparel:Marcaine mix  Complications: none  Implant: right and left large Bard 3D max mesh  Images:      Gurney Maxin, M.D. General, Bariatric, & Minimally Invasive Surgery [facility], [place] 10:09 AM 10/22/2019

## 2019-10-22 NOTE — Transfer of Care (Signed)
Immediate Anesthesia Transfer of Care Note  Patient: [name] [name]  Procedure(s) Performed: Procedure(s) (LRB): HERNIA REPAIR UMBILICAL ADULT (N/A) LAPAROSCOPIC BILATERAL INGUINAL HERNIA REPAIR WITH MESH (Bilateral)  Patient Location: PACU  Anesthesia Type: General  Level of Consciousness: awake, sedated, patient cooperative and responds to stimulation  Airway & Oxygen Therapy: Patient Spontanous Breathing and Patient connected to [place] 02 and soft FM   Post-op Assessment: Report given to PACU RN, Post -op Vital signs reviewed and stable and Patient moving all extremities  Post vital signs: Reviewed and stable  Complications: No apparent anesthesia complications

## 2019-10-22 NOTE — Anesthesia Postprocedure Evaluation (Signed)
Anesthesia Post Note  Patient: [name] [name]  Procedure(s) Performed: HERNIA REPAIR UMBILICAL ADULT (N/A Abdomen) LAPAROSCOPIC BILATERAL INGUINAL HERNIA REPAIR WITH MESH (Bilateral Groin)     Patient location during evaluation: PACU Anesthesia Type: General Level of consciousness: awake and alert Pain management: pain level controlled Vital Signs Assessment: post-procedure vital signs reviewed and stable Respiratory status: spontaneous breathing, nonlabored ventilation and respiratory function stable Cardiovascular status: blood pressure returned to baseline and stable Postop Assessment: no apparent nausea or vomiting Anesthetic complications: no Comments: Patient went into Afib towards the end of the procedure, confirmed with EKG done in PACU. Patient stable, denies any SOB or chest pain. Given patient is hemodynamically stable, symptom free, and has a previous history of afib, instructed patient to call his cardiologist later today to inform him of the afib and arrange followup. Patient expressed understanding.   No complications documented.  Last Vitals:  Vitals:   10/22/19 1115 10/22/19 1159  BP: 126/72 (!) 143/80  Pulse: (!) 124 (!) 121  Resp: (!) 21 16  Temp: 37.2 C 36.9 C  SpO2: 96% 96%    Last Pain:  Vitals:   10/22/19 1159  TempSrc:   PainSc: 0-No pain                 Audry Pili

## 2019-10-23 ENCOUNTER — Telehealth: Payer: Self-pay | Admitting: Cardiovascular Disease

## 2019-10-23 ENCOUNTER — Encounter (HOSPITAL_BASED_OUTPATIENT_CLINIC_OR_DEPARTMENT_OTHER): Payer: Self-pay | Admitting: General Surgery

## 2019-10-23 NOTE — Telephone Encounter (Signed)
Spoke to patient's wife she stated husband had hernia surgery yesterday.She was told husband needed to be seen his heart after surgery went into afib.Stated husband is not aware heart is out of rhythm.Appointment scheduled with Fabian Sharp PA Monday 10/11 at 8:45 am.

## 2019-10-23 NOTE — Telephone Encounter (Signed)
Patient c/o Palpitations:  High priority if patient c/o lightheadedness, shortness of breath, or chest pain  1) How long have you had palpitations/irregular HR/ Afib? Are you having the symptoms now? Patient had hernia surgery yesterday and was advised he was in afib. Was told to call his cardiologist yesterday but wife forgot who his cardiologist was.   2) Are you currently experiencing lightheadedness, SOB or CP? no  3) Do you have a history of afib (atrial fibrillation) or irregular heart rhythm? yes  4) Have you checked your BP or HR? (document readings if available): Unsure. Per wife, they have two BP machines and they do not work right. She states that it was high yesterday after his hernia surgery.   5) Are you experiencing any other symptoms? Has a real bad cold with a cough. Covid test was negative.

## 2019-10-25 NOTE — Progress Notes (Addendum)
Cardiology Office Note:    Date:  10/26/2019   ID:  [name] [name], DOB January 15, 1951, MRN [id number]  PCP:  Mayra Neer, MD  Cardiologist:  Sanda Klein, MD   Referring MD: Mayra Neer, MD   Chief Complaint  Patient presents with  . Follow-up    Afib  . Headache  . Shortness of Breath    History of Present Illness:    [name] [name] is a 69 y.o. male with a hx of persistent atrial fibrillation s/p ablation 08/2016, nonischemic cardiomyopathy, HLD, GERD, and obesity. He had cardioversion 07/23/16, but quickly reverted back to Afib. Afib ablation on 09/07/2016, but reverted back to Afib and had another cardioversion 10/03/16. He was not anticoagulated dur to a CHA2DS2-VASc Score of 1 (before age 30). 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  Remote normal nuclear stress test in 2008. He was intolerant to simvastatin, pravastatin, and rosuvastatin. Currently tolerating livalo. It appears that he has maintained sinus rhythm since August 2019. He was last seen by Dr. Sallyanne Kuster 12/23/18 and was in sinus rhythm at that time.   He had hernia surgery on 10/22/19 and converted back to Afib in the perioperative period. Pt is asymptomatic. He was added to my schedule today. He reports some occasional fluttering after surgery, but does not know when he converted back to sinus rhythm. He denies falls, palpitations, and near/syncope.     Past Medical History:  Diagnosis Date  . Cancer ([place]) 2015   melanoma removed from back  . GERD (gastroesophageal reflux disease)   . History of cardiac dysrhythmia 2018   ablation and cardioversion done  . Hyperlipidemia   . Nonischemic cardiomyopathy ([place])   . Scab    right elbow healing  . Umbilical hernia     Past Surgical History:  Procedure Laterality Date  . ATRIAL FIBRILLATION ABLATION N/A 09/07/2016    Procedure: Atrial Fibrillation Ablation;  Surgeon: Constance Haw, MD;  Location: [facility];  Service: Cardiovascular;  Laterality: N/A;  . CARDIOVERSION N/A 07/23/2016   Procedure: CARDIOVERSION;  Surgeon: Sanda Klein, MD;  Location: [facility] END[facility]COPY;  Service: Cardiovascular;  Laterality: N/A;  . CARDIOVERSION N/A 10/03/2016   Procedure: CARDIOVERSION;  Surgeon: Josue Hector, MD;  Location: [facility] ENDOSCOPY;  Service: Cardiovascular;  Laterality: N/A;  . HERNIA REPAIR  at birth  . INGUINAL HERNIA REPAIR Bilateral 10/22/2019   Procedure: LAPAROSCOPIC BILATERAL INGUINAL HERNIA REPAIR WITH MESH;  Surgeon: Kinsinger, Arta Bruce, MD;  Location: [place];  Service: General;  Laterality: Bilateral;  . UMBILICAL HERNIA REPAIR N/A 10/22/2019   Procedure: HERNIA REPAIR UMBILICAL ADULT;  Surgeon: Kinsinger, Arta Bruce, MD;  Location: [facility];  Service: General;  Laterality: N/A;    Current Medications: Current Meds  Medication Sig  . aspirin EC 81 MG tablet Take 81 mg by mouth daily. Swallow whole.  . Cholecalciferol (VITAMIN D3) 75 MCG (3000 UT) TABS Take by mouth. 50 mg daily  . ibuprofen (ADVIL) 800 MG tablet Take 1 tablet (800 mg total) by mouth every 8 (eight) hours as needed.  . lovastatin (MEVACOR) 10 MG tablet Take 10 mg by mouth daily.  [name] omeprazole (PRILOSEC) 20 MG capsule Take 20 mg by mouth daily.  [name] OVER THE COUNTER MEDICATION Allergy relief dipenhydramine 25 mg in am  . oxyCODONE (OXY IR/ROXICODONE) 5 MG immediate release tablet Take 1  tablet (5 mg total) by mouth every 6 (six) hours as needed for severe pain.  [name] UNABLE TO FIND Med Name: calcium 600 mg daily     Allergies:   Patient has no known allergies.   Social History   Socioeconomic History  . Marital status: Married    Spouse name: Not on file  . Number of children: Not on file  . Years of education: Not on file  . Highest education level: Not on file  Occupational History    . Not on file  Tobacco Use  . Smoking status: Never Smoker  . Smokeless tobacco: Never Used  Vaping Use  . Vaping Use: Never used  Substance and Sexual Activity  . Alcohol use: Yes    Comment: 1-2 beers per day  . Drug use: No  . Sexual activity: Not on file  Other Topics Concern  . Not on file  Social History Narrative  . Not on file   Social Determinants of Health   Financial Resource Strain:   . Difficulty of Paying Living Expenses: Not on file  Food Insecurity:   . Worried About Charity fundraiser in the Last Year: Not on file  . Ran Out of Food in the Last Year: Not on file  Transportation Needs:   . Lack of Transportation (Medical): Not on file  . Lack of Transportation (Non-Medical): Not on file  Physical Activity:   . Days of Exercise per Week: Not on file  . Minutes of Exercise per Session: Not on file  Stress:   . Feeling of Stress : Not on file  Social Connections:   . Frequency of Communication with Friends and Family: Not on file  . Frequency of Social Gatherings with Friends and Family: Not on file  . Attends Religious Services: Not on file  . Active Member of Clubs or Organizations: Not on file  . Attends Archivist Meetings: Not on file  . Marital Status: Not on file     Family History: The patient's family history includes Alzheimer's disease in his sister; Heart Problems in his brother, brother, and father; Stroke in his brother.  ROS:   Please see the history of present illness.    All other systems reviewed and are negative.  EKGs/Labs/Other Studies Reviewed:    The following studies were reviewed today:  Echo 08/2017 Study Conclusions   - Left ventricle: The cavity size was normal. Systolic function was  normal. The estimated ejection fraction was in the range of 55%  to 60%. Wall motion was normal; there were no regional wall  motion abnormalities. Features are consistent with a pseudonormal  left ventricular filling  pattern, with concomitant abnormal  relaxation and increased filling pressure (grade 2 diastolic  dysfunction). Doppler parameters are consistent with  indeterminate ventricular filling pressure.  - Aortic valve: Transvalvular velocity was within the normal range.  There was no stenosis. There was no regurgitation.  - Mitral valve: Transvalvular velocity was within the normal range.  There was no evidence for stenosis. There was trivial  regurgitation.  - Right ventricle: The cavity size was normal. Wall thickness was  normal. Systolic function was normal.  - Tricuspid valve: There was no regurgitation.   EKG:  EKG is ordered today.  The ekg ordered today demonstrates sinus rhythm with HR 66, nonspecific T wave changes stable from prior, left axis deviation  Recent Labs: 10/22/2019: BUN 15; Creatinine, Ser 0.90; Hemoglobin 15.3; Potassium 4.4; Sodium 139  Recent  Lipid Panel    Component Value Date/Time   CHOL 182 09/13/2017 0740   TRIG 109 09/13/2017 0740   HDL 45 09/13/2017 0740   CHOLHDL 4.0 09/13/2017 0740   LDLCALC 115 (H) 09/13/2017 0740    Physical Exam:    VS:  BP 132/70 (BP Location: Left Arm, Patient Position: Sitting, Cuff Size: Normal)   Pulse 66   Ht 5\' 8"  (1.727 m)   Wt 248 lb (112.5 kg)   BMI 37.71 kg/m     Wt Readings from Last 3 Encounters:  10/26/19 248 lb (112.5 kg)  10/22/19 241 lb 3.2 oz (109.4 kg)  12/23/18 251 lb (113.9 kg)     GEN: obese male in no acute distress HEENT: Normal NECK: No JVD; No carotid bruits LYMPHATICS: No lymphadenopathy CARDIAC: RRR, no murmurs, rubs, gallops RESPIRATORY:  Clear to auscultation without rales, wheezing or rhonchi  ABDOMEN: s/p hernia surgery - tender abdomen MUSCULOSKELETAL:  No edema; No deformity  SKIN: Warm and dry NEUROLOGIC:  Alert and oriented x 3 PSYCHIATRIC:  Normal affect   ASSESSMENT:    1. Persistent atrial fibrillation ([place])   2. Hypercholesterolemia   3. Moderate obesity   4.  Coronary artery calcification seen on CAT scan    PLAN:    In order of problems listed above:  Paroxysmal Afib - s/p DCCV --> ablation -->  DCCV - EKG on 10/22/19 appears to be coarse Afib --> suspect this was related to the stress of surgery/anesthesia - EKG today with sinus rhythm with HR 66 --> he does not know when he converted This patients CHA2DS2-VASc Score and unadjusted Ischemic Stroke Rate (% per year) is equal to 3.2 % stroke rate/year from a score of 3 (age, CAD) - previously felt tired on lopressor  - he is generally unaware of his Afib - we discussed anticoagulation and he reports that it will be difficult to take twice daily medication - will start xarelto when cleared by surgeon - Dr. Reece Agar - I will also notify Dr. Sallyanne Kuster - will check TSH, Mg, CBC at next blood draw with PCP - we reviewed signs of bleeding and when to report to the ER - no signs of CHF - will hold off on echo  When cleared by surgery, will stop ASA and start 20 mg xarelto.   Coronary calcification on CT - normal nuclear study 2008 - family history of CAD - 3v calcifications on CT 2018 - no chest pain - will stop ASA when DOAC initiated - continue risk factor modification   Hyperlipidemia  - favor LDL goal < 70 - livalo - repeat fasting lipid panel at next blood draw   Obesity - he does snore - we discussed sleep apnea and a sleep study - he will think about it   He expects to have labs drawn soon with his PCP. I have listed the following labs in his AVS: CBC, Mg, TSH, and fasting lipids.  Follow up with Dr. Sallyanne Kuster in 3 months.    ADDENDUM: Per surgery and Dr. Sallyanne Kuster, may start 20 mg xarelto daily for Afib. Will also repeat an echo.   Medication Adjustments/Labs and Tests Ordered: Current medicines are reviewed at length with the patient today.  Concerns regarding medicines are outlined above.  Orders Placed This Encounter  Procedures  . EKG 12-Lead   No orders of the  defined types were placed in this encounter.   Signed, Tami Lin Melvena Vink, PA  10/26/2019 9:19 AM  [facility]

## 2019-10-26 ENCOUNTER — Ambulatory Visit (INDEPENDENT_AMBULATORY_CARE_PROVIDER_SITE_OTHER): Payer: Medicare Other | Admitting: Physician Assistant

## 2019-10-26 ENCOUNTER — Encounter: Payer: Self-pay | Admitting: Physician Assistant

## 2019-10-26 VITALS — BP 132/70 | HR 66 | Ht 68.0 in | Wt 248.0 lb

## 2019-10-26 DIAGNOSIS — E668 Other obesity: Secondary | ICD-10-CM | POA: Diagnosis not present

## 2019-10-26 DIAGNOSIS — I428 Other cardiomyopathies: Secondary | ICD-10-CM

## 2019-10-26 DIAGNOSIS — I4819 Other persistent atrial fibrillation: Secondary | ICD-10-CM | POA: Diagnosis not present

## 2019-10-26 DIAGNOSIS — E7800 Pure hypercholesterolemia, unspecified: Secondary | ICD-10-CM | POA: Diagnosis not present

## 2019-10-26 DIAGNOSIS — I2510 Atherosclerotic heart disease of native coronary artery without angina pectoris: Secondary | ICD-10-CM

## 2019-10-26 NOTE — Telephone Encounter (Signed)
AFib is not new for him.

## 2019-10-26 NOTE — Patient Instructions (Signed)
Medication Instructions:  Continue current medications  *If you need a refill on your cardiac medications before your next appointment, please call your pharmacy*   Lab Work: None Ordered   Testing/Procedures: None Ordered   Follow-Up: At [facility], you and your health needs are our priority.  As part of our continuing mission to provide you with exceptional heart care, we have created designated Provider Care Teams.  These Care Teams include your primary Cardiologist (physician) and Advanced Practice Providers (APPs -  Physician Assistants and Nurse Practitioners) who all work together to provide you with the care you need, when you need it.  We recommend signing up for the patient portal called "MyChart".  Sign up information is provided on this After Visit Summary.  MyChart is used to connect with patients for Virtual Visits (Telemedicine).  Patients are able to view lab/test results, encounter notes, upcoming appointments, etc.  Non-urgent messages can be sent to your provider as well.   To learn more about what you can do with MyChart, go to [email or web address].    Your next appointment:   3 month(s)   The format for your next appointment:   In Person  Provider:   You may see Sanda Klein, MD or one of the following Advanced Practice Providers on your designated Care Team:    Almyra Deforest, PA-C  Fabian Sharp, [place] or   Roby Lofts, [place]    Other Instructions Please have your primary care physician draw labs: CBC, TSH, Magnesium and Fasting Lipids

## 2019-10-27 ENCOUNTER — Telehealth: Payer: Self-pay

## 2019-10-27 MED ORDER — RIVAROXABAN 20 MG PO TABS: 20.0000 mg | ORAL_TABLET | Freq: Every day | ORAL | 3 refills | Status: DC

## 2019-10-27 NOTE — Telephone Encounter (Signed)
Called pt to inform Angie Duke, PA would like for him to stop ASA and start Xarelto 20 mg daily for Aifb. She would also like for him to have an ECHO to assess NICM.  Pt verbalized understanding,  Samples placed up front for pick up until he receive mail order.  Xarelto 20 mg  Qty: 2 bottle Lot # [id number] Exp: 8/23

## 2019-10-27 NOTE — Addendum Note (Signed)
Addended by: Minette Brine on: 10/27/2019 02:46 PM   Modules accepted: Orders

## 2019-11-03 ENCOUNTER — Telehealth: Payer: Self-pay | Admitting: Physician Assistant

## 2019-11-03 DIAGNOSIS — I4819 Other persistent atrial fibrillation: Secondary | ICD-10-CM

## 2019-11-03 NOTE — Telephone Encounter (Signed)
New Message:    Wife called and said [facility] had called her. They wanted her to contact the office and let them know they did not have enough information to refill his Xarelto.

## 2019-11-03 NOTE — Telephone Encounter (Signed)
Spoke to pharmacist at [facility], who states a PA will need to be done.  PA line phone#: [phone]  To PA nurse for assistance.

## 2019-11-04 MED ORDER — RIVAROXABAN 20 MG PO TABS: 20.0000 mg | ORAL_TABLET | Freq: Every day | ORAL | 3 refills | Status: DC

## 2019-11-04 NOTE — Telephone Encounter (Signed)
**Note De-Identified Braxon Suder Obfuscation** I started a Xarelto PA through covermymeds: Key: [id number]  Following message received: [employer] Key: [id number] - PA Case ID: [id number]  Outcome  This request has been approved using information available on the patient's profile.  Case FO:[id number];OMVEHM:[id number];Review Type:Prior Auth;Coverage Start Date:10/05/2019;Coverage End Date:11/03/2020 DrugXarelto 20MG  tablets  Form[employer] Electronic PA Form (2017 NCPDP)  I have notified [facility] of this approval.

## 2019-11-18 ENCOUNTER — Other Ambulatory Visit: Payer: Self-pay

## 2019-11-18 ENCOUNTER — Ambulatory Visit (HOSPITAL_COMMUNITY): Payer: Medicare Other | Attending: Cardiovascular Disease

## 2019-11-18 DIAGNOSIS — I428 Other cardiomyopathies: Secondary | ICD-10-CM | POA: Diagnosis not present

## 2019-11-18 DIAGNOSIS — I4819 Other persistent atrial fibrillation: Secondary | ICD-10-CM | POA: Diagnosis not present

## 2019-11-18 LAB — ECHOCARDIOGRAM COMPLETE
Area-P 1/2: 3.72 cm2
S' Lateral: 3.6 cm

## 2019-12-03 ENCOUNTER — Ambulatory Visit (INDEPENDENT_AMBULATORY_CARE_PROVIDER_SITE_OTHER): Payer: Medicare Other

## 2019-12-03 ENCOUNTER — Ambulatory Visit
Admission: EM | Admit: 2019-12-03 | Discharge: 2019-12-03 | Disposition: A | Discharge status: Home / Self Care | Payer: Medicare Other | Attending: Physician Assistant | Admitting: Physician Assistant

## 2019-12-03 ENCOUNTER — Other Ambulatory Visit: Payer: Self-pay

## 2019-12-03 DIAGNOSIS — J22 Unspecified acute lower respiratory infection: Secondary | ICD-10-CM

## 2019-12-03 DIAGNOSIS — R059 Cough, unspecified: Secondary | ICD-10-CM

## 2019-12-03 MED ORDER — DOXYCYCLINE HYCLATE 100 MG PO CAPS: 100.0000 mg | ORAL_CAPSULE | Freq: Two times a day (BID) | ORAL | 0 refills | Status: DC

## 2019-12-03 NOTE — ED Triage Notes (Signed)
Pt c/o productive cough with green sputum with nasal congestion since Saturday. States had the same thing 2wks ago and his PCP tx'd him and it came back.

## 2019-12-03 NOTE — Discharge Instructions (Signed)
Chest xray without alarming signs. Start doxycycline as directed. Continue tessalon for cough if needed. Can use over the counter flonase for nasal congestion/drainage. Follow up with PCP if symptoms still not improving. Monitor for any worsening of symptoms, chest pain, shortness of breath, wheezing, swelling of the throat, go to the emergency department for further evaluation needed.

## 2019-12-03 NOTE — ED Provider Notes (Signed)
[name]    CSN: [id number] Arrival date & time: 12/03/19  1529      History   Chief Complaint Chief Complaint  Patient presents with   Cough    HPI [name] [name] is a 69 y.o. male.   69 year old male with history of afib on xarelto, HLD, comes in for 2 week history of URI symptoms. States had virtual visit with PCP, and was provided tessalon with some relief. However, symptoms never completely resolve. Few days ago, cough became worse with nasal congestion. States has had shortness of breath since symptom started without worsening. Denies fever. Denies leg swelling, orthopnea.      Past Medical History:  Diagnosis Date   Cancer ([place]) 2015   melanoma removed from back   GERD (gastroesophageal reflux disease)    History of cardiac dysrhythmia 2018   ablation and cardioversion done   Hyperlipidemia    Nonischemic cardiomyopathy ([place])    Scab    right elbow healing   Umbilical hernia     Patient Active Problem List   Diagnosis Date Noted   AF (atrial fibrillation) ([place]) 09/07/2016   Family history of coronary artery disease in brother 07/03/2016   Nonischemic cardiomyopathy ([place]) 06/08/2016   Hyperglycemia 06/08/2016   Atrial fibrillation ([place]) 06/08/2016   Hypercholesterolemia 06/08/2016   Moderate obesity 06/08/2016    Past Surgical History:  Procedure Laterality Date   ATRIAL FIBRILLATION ABLATION N/A 09/07/2016   Procedure: Atrial Fibrillation Ablation;  Surgeon: Constance Haw, MD;  Location: [place] [facility];  Service: Cardiovascular;  Laterality: N/A;   CARDIOVERSION N/A 07/23/2016   Procedure: CARDIOVERSION;  Surgeon: Sanda Klein, MD;  Location: [place] [facility];  Service: Cardiovascular;  Laterality: N/A;   CARDIOVERSION N/A 10/03/2016   Procedure: CARDIOVERSION;  Surgeon: Josue Hector, MD;  Location: [facility];  Service: Cardiovascular;  Laterality: N/A;   HERNIA REPAIR  at birth   [place]  Bilateral 10/22/2019   Procedure: LAPAROSCOPIC BILATERAL INGUINAL HERNIA REPAIR WITH MESH;  Surgeon: Kinsinger, Arta Bruce, MD;  Location: [place];  Service: General;  Laterality: Bilateral;   UMBILICAL HERNIA REPAIR N/A 10/22/2019   Procedure: HERNIA REPAIR UMBILICAL ADULT;  Surgeon: Kinsinger, Arta Bruce, MD;  Location: [facility];  Service: General;  Laterality: N/A;       Home Medications    Prior to Admission medications   Medication Sig Start Date End Date Taking? Authorizing Provider  Cholecalciferol (VITAMIN D3) 75 MCG (3000 UT) TABS Take by mouth. 50 mg daily    [provider]  doxycycline (VIBRAMYCIN) 100 MG capsule Take 1 capsule (100 mg total) by mouth 2 (two) times daily. 12/03/19   Tasia Catchings, Alyan Hartline V, PA-C  lovastatin (MEVACOR) 10 MG tablet Take 10 mg by mouth daily.    [provider]  omeprazole (PRILOSEC) 20 MG capsule Take 20 mg by mouth daily.    [provider]  OVER THE COUNTER MEDICATION Allergy relief dipenhydramine 25 mg in am    [provider]  rivaroxaban (XARELTO) 20 MG TABS tablet Take 1 tablet (20 mg total) by mouth daily with supper. 11/04/19   Croitoru, Dani Gobble, MD  UNABLE TO FIND Med Name: calcium 600 mg daily    [provider]    Family History Family History  Problem Relation Age of Onset   Heart Problems Father    Alzheimer's disease Sister    Stroke Brother    Heart Problems Brother  Heart Problems Brother     Social History Social History   Tobacco Use   Smoking status: Never Smoker   Smokeless tobacco: Never Used  Scientific laboratory technician Use: Never used  Substance Use Topics   Alcohol use: Yes    Comment: 1-2 beers per day   Drug use: No     Allergies   Patient has no known allergies.   Review of Systems Review of Systems  Reason unable to perform ROS: See HPI as above.     Physical Exam Triage Vital Signs ED Triage Vitals  Enc Vitals Group       BP 12/03/19 1612 (!) 143/87     Pulse Rate 12/03/19 1612 88     Resp 12/03/19 1612 18     Temp 12/03/19 1612 99.4 F (37.4 C)     Temp Source 12/03/19 1612 Oral     SpO2 12/03/19 1612 95 %     Weight --      Height --      Head Circumference --      Peak Flow --      Pain Score 12/03/19 1620 8     Pain Loc --      Pain Edu? --      Excl. in [place]? --    No data found.  Updated Vital Signs BP (!) 143/87 (BP Location: Left Arm)    Pulse 88    Temp 99.4 F (37.4 C) (Oral)    Resp 18    SpO2 95%   Physical Exam Constitutional:      General: He is not in acute distress.    Appearance: He is well-developed. He is not ill-appearing, toxic-appearing or diaphoretic.  HENT:     Head: Normocephalic and atraumatic.     Right Ear: Tympanic membrane, ear canal and external ear normal. Tympanic membrane is not erythematous or bulging.     Left Ear: Tympanic membrane, ear canal and external ear normal. Tympanic membrane is not erythematous or bulging.     Nose: Rhinorrhea present.     Right Sinus: No maxillary sinus tenderness or frontal sinus tenderness.     Left Sinus: No maxillary sinus tenderness or frontal sinus tenderness.     Mouth/Throat:     Mouth: Mucous membranes are moist.     Pharynx: Oropharynx is clear. Uvula midline.  Eyes:     Conjunctiva/sclera: Conjunctivae normal.     Pupils: Pupils are equal, round, and reactive to light.  Cardiovascular:     Rate and Rhythm: Normal rate and regular rhythm.  Pulmonary:     Effort: Pulmonary effort is normal. No accessory muscle usage, prolonged expiration, respiratory distress or retractions.     Breath sounds: No decreased air movement or transmitted upper airway sounds. No decreased breath sounds.     Comments: LCTAB Musculoskeletal:     Cervical back: Normal range of motion and neck supple.  Skin:    General: Skin is warm and dry.  Neurological:     Mental Status: He is alert and oriented to person, place, and time.       UC Treatments / Results  Labs (all labs ordered are listed, but only abnormal results are displayed) Labs Reviewed - No data to display  EKG   Radiology DG Chest 2 View  Result Date: 12/03/2019 CLINICAL DATA:  Productive cough w/ green sputum worse when laying down, chills, body aches, and nasal congestion x 5 days. Hx of afib w/abrasion.  No known lung conditions. Non smoker. cough x 2 weeks. shob EXAM: CHEST - 2 VIEW COMPARISON:  None. FINDINGS: Normal mediastinum and cardiac silhouette. Normal pulmonary vasculature. No evidence of effusion, infiltrate, or pneumothorax. No acute bony abnormality. Degenerative osteophytosis of the spine. IMPRESSION: No acute cardiopulmonary process. Electronically Signed   By: Suzy Bouchard M.D.   On: 12/03/2019 17:19    Procedures Procedures (including critical care time)  Medications Ordered in UC Medications - No data to display  Initial Impression / Assessment and Plan / UC Course  I have reviewed the triage vital signs and the nursing notes.  Pertinent labs & imaging results that were available during my care of the patient were reviewed by me and considered in my medical decision making (see chart for details).    CXR negative for acute cardiopulmonary processes. Doxycycline as directed. Other symptomatic treatment discussed. Return precautions given.  Final Clinical Impressions(s) / UC Diagnoses   Final diagnoses:  Lower respiratory infection    ED Prescriptions    Medication Sig Dispense Auth. Provider   doxycycline (VIBRAMYCIN) 100 MG capsule Take 1 capsule (100 mg total) by mouth 2 (two) times daily. 14 capsule Ok Edwards, PA-C     PDMP not reviewed this encounter.   Ok Edwards, PA-C 12/04/19 [phone]

## 2019-12-31 DIAGNOSIS — H906 Mixed conductive and sensorineural hearing loss, bilateral: Secondary | ICD-10-CM | POA: Diagnosis not present

## 2019-12-31 DIAGNOSIS — Z570 Occupational exposure to noise: Secondary | ICD-10-CM | POA: Diagnosis not present

## 2019-12-31 DIAGNOSIS — Z822 Family history of deafness and hearing loss: Secondary | ICD-10-CM | POA: Diagnosis not present

## 2019-12-31 DIAGNOSIS — H9313 Tinnitus, bilateral: Secondary | ICD-10-CM | POA: Diagnosis not present

## 2020-01-27 ENCOUNTER — Encounter: Payer: Self-pay | Admitting: Cardiovascular Disease

## 2020-01-27 ENCOUNTER — Ambulatory Visit (INDEPENDENT_AMBULATORY_CARE_PROVIDER_SITE_OTHER): Payer: Medicare Other | Admitting: Cardiovascular Disease

## 2020-01-27 ENCOUNTER — Other Ambulatory Visit: Payer: Self-pay

## 2020-01-27 VITALS — BP 126/76 | HR 66 | Ht 68.0 in | Wt 247.0 lb

## 2020-01-27 DIAGNOSIS — I480 Paroxysmal atrial fibrillation: Secondary | ICD-10-CM

## 2020-01-27 DIAGNOSIS — E7800 Pure hypercholesterolemia, unspecified: Secondary | ICD-10-CM | POA: Diagnosis not present

## 2020-01-27 DIAGNOSIS — Z7901 Long term (current) use of anticoagulants: Secondary | ICD-10-CM

## 2020-01-27 DIAGNOSIS — I428 Other cardiomyopathies: Secondary | ICD-10-CM

## 2020-01-27 DIAGNOSIS — Z6838 Body mass index (BMI) 38.0-38.9, adult: Secondary | ICD-10-CM

## 2020-01-27 NOTE — Patient Instructions (Signed)

## 2020-01-27 NOTE — Progress Notes (Signed)
Cardiology consultation Note:    Date:  01/27/2020   ID:  [name] [name], DOB [birth year], MRN [id number]  PCP:  Mayra Neer, MD  Cardiologist:  Sanda Klein, MD    Referring MD: Mayra Neer, MD  Chief Complaint  Patient presents with  . Follow-up    3 months.    History of Present Illness:    [name] [name] is a 70 y.o. male with a hx of radiofrequency ablation for atrial fibrillation in August 2018, so far with an excellent result.  He also has hyperlipidemia, gastroesophageal reflux disease and remote melanoma resection.  Since ablation, he had his first documented atrial fibrillation recurrence following umbilical hernia surgery.  The arrhythmia resolved spontaneously.  He was restarted on Xarelto anticoagulation at that time.  He is not aware of palpitations during the arrhythmia.  He is done quite well since recovery from surgery and has not had any clinically documented recurrence of irregular rhythm.  He feels that he has his usual level of energy and stamina. The patient specifically denies any chest pain at rest exertion, dyspnea at rest or with exertion, orthopnea, paroxysmal nocturnal dyspnea, syncope, palpitations, focal neurological deficits, intermittent claudication, lower extremity edema, unexplained weight gain, cough, hemoptysis or wheezing.  Note that when he presented with atrial fibrillation his ventricular rate was spontaneously well controlled suggesting some degree of conduction system disease.  He also has left anterior fascicular block.  Preablation echocardiogram in June 2018 showed diffuse LV hypokinesis with EF of 40-45%.  Follow-up echo in August 2019 showed normal left ventricular systolic function and no serious valvular abnormalities.  Left atrial size was normal on both studies.  He had a remote normal nuclear stress test in 2008.  He has tried numerous statins for hyperlipidemia (including simvastatin, pravastatin, rosuvastatin) and the only one he  has tolerated is Livalo.   Past Medical History:  Diagnosis Date  . Cancer ([place]) 2015   melanoma removed from back  . GERD (gastroesophageal reflux disease)   . History of cardiac dysrhythmia 2018   ablation and cardioversion done  . Hyperlipidemia   . Nonischemic cardiomyopathy ([place])   . Scab    right elbow healing  . Umbilical hernia     Past Surgical History:  Procedure Laterality Date  . ATRIAL FIBRILLATION ABLATION N/A 09/07/2016   Procedure: Atrial Fibrillation Ablation;  Surgeon: Constance Haw, MD;  Location: [place];  Service: Cardiovascular;  Laterality: N/A;  . CARDIOVERSION N/A 07/23/2016   Procedure: CARDIOVERSION;  Surgeon: Sanda Klein, MD;  Location: [place];  Service: Cardiovascular;  Laterality: N/A;  . CARDIOVERSION N/A 10/03/2016   Procedure: CARDIOVERSION;  Surgeon: Josue Hector, MD;  Location: [facility];  Service: Cardiovascular;  Laterality: N/A;  . HERNIA REPAIR  at birth  . INGUINAL HERNIA REPAIR Bilateral 10/22/2019   Procedure: LAPAROSCOPIC BILATERAL INGUINAL HERNIA REPAIR WITH MESH;  Surgeon: Kinsinger, Arta Bruce, MD;  Location: [place];  Service: General;  Laterality: Bilateral;  . UMBILICAL HERNIA REPAIR N/A 10/22/2019   Procedure: HERNIA REPAIR UMBILICAL ADULT;  Surgeon: Kinsinger, Arta Bruce, MD;  Location: [facility];  Service: General;  Laterality: N/A;    Current Medications: Current Meds  Medication Sig  . Cholecalciferol (VITAMIN D3) 75 MCG (3000 UT) TABS Take by mouth. 50 mg daily  . doxycycline (VIBRAMYCIN) 100 MG capsule Take 1 capsule (100 mg total) by mouth 2 (two) times daily.  [name] lovastatin (MEVACOR) 10 MG tablet Take 10 mg  by mouth daily.  [name] omeprazole (PRILOSEC) 20 MG capsule Take 20 mg by mouth daily.  [name] OVER THE COUNTER MEDICATION Allergy relief dipenhydramine 25 mg in am  . rivaroxaban (XARELTO) 20 MG TABS tablet Take 1 tablet (20 mg total) by mouth daily with supper.   [name] UNABLE TO FIND Med Name: calcium 600 mg daily     Allergies:   Patient has no known allergies.   Social History   Socioeconomic History  . Marital status: Married    Spouse name: Not on file  . Number of children: Not on file  . Years of education: Not on file  . Highest education level: Not on file  Occupational History  . Not on file  Tobacco Use  . Smoking status: Never Smoker  . Smokeless tobacco: Never Used  Vaping Use  . Vaping Use: Never used  Substance and Sexual Activity  . Alcohol use: Yes    Comment: 1-2 beers per day  . Drug use: No  . Sexual activity: Not on file  Other Topics Concern  . Not on file  Social History Narrative  . Not on file   Social Determinants of Health   Financial Resource Strain: Not on file  Food Insecurity: Not on file  Transportation Needs: Not on file  Physical Activity: Not on file  Stress: Not on file  Social Connections: Not on file     Family History: The patient's family history includes Alzheimer's disease in his sister; Heart Problems in his brother, brother, and father; Stroke in his brother. ROS:   Please see the history of present illness.    All other systems are reviewed and are negative.   EKGs/Labs/Other Studies Reviewed:    The following studies were reviewed today: Echocardiogram 09/09/2017 - Left ventricle: The cavity size was normal. Systolic function was   normal. The estimated ejection fraction was in the range of 55%   to 60%. Wall motion was normal; there were no regional wall   motion abnormalities. Features are consistent with a pseudonormal   left ventricular filling pattern, with concomitant abnormal   relaxation and increased filling pressure (grade 2 diastolic   dysfunction). Doppler parameters are consistent with   indeterminate ventricular filling pressure. - Aortic valve: Transvalvular velocity was within the normal range.   There was no stenosis. There was no regurgitation. - Mitral  valve: Transvalvular velocity was within the normal range.   There was no evidence for stenosis. There was trivial   regurgitation. - Right ventricle: The cavity size was normal. Wall thickness was   normal. Systolic function was normal. - Tricuspid valve: There was no regurgitation. Radiofrequency ablation report and cardioversion report from 2018  EKG:  EKG is not ordered today.  The ECG from 10/26/2019 is reviewed.  It shows sinus rhythm with PVCs and a left anterior fascicular block.  Echo 08/2017 Study Conclusions   - Left ventricle: The cavity size was normal. Systolic function was  normal. The estimated ejection fraction was in the range of 55%  to 60%. Wall motion was normal; there were no regional wall  motion abnormalities. Features are consistent with a pseudonormal  left ventricular filling pattern, with concomitant abnormal  relaxation and increased filling pressure (grade 2 diastolic  dysfunction). Doppler parameters are consistent with  indeterminate ventricular filling pressure.  - Aortic valve: Transvalvular velocity was within the normal range.  There was no stenosis. There was no regurgitation.  - Mitral valve: Transvalvular velocity was within the  normal range.  There was no evidence for stenosis. There was trivial  regurgitation.  - Right ventricle: The cavity size was normal. Wall thickness was  normal. Systolic function was normal.  - Tricuspid valve: There was no regurgitation.   Recent Labs: 05/30/2016 hemoglobin 15.4, glucose 126, BUN 18, creatinine 1.1, potassium 3.8, normal liver function tests, TSH 2.6 Hemoglobin A1c 5.8% BMET    Component Value Date/Time   NA 139 10/22/2019 0628   NA 143 09/04/2016 1457   K 4.4 10/22/2019 0628   CL 101 10/22/2019 0628   CO2 27 09/27/2016 1007   GLUCOSE 104 (H) 10/22/2019 0628   BUN 15 10/22/2019 0628   BUN 14 09/04/2016 1457   CREATININE 0.90 10/22/2019 0628   CALCIUM 9.3 09/27/2016 1007    GFRNONAA >60 09/27/2016 1007   GFRAA >60 09/27/2016 1007     Total cholesterol 190, triglycerides 132, HDL 46, LDL 117  Lipid Panel     Component Value Date/Time   CHOL 182 09/13/2017 0740   TRIG 109 09/13/2017 0740   HDL 45 09/13/2017 0740   CHOLHDL 4.0 09/13/2017 0740   LDLCALC 115 (H) 09/13/2017 0740   LABVLDL 22 09/13/2017 0740     Physical Exam:    VS:  BP 126/76 (BP Location: Left Arm, Patient Position: Sitting, Cuff Size: Large)   Pulse 66   Ht 5\' 8"  (1.727 m)   Wt 247 lb (112 kg)   BMI 37.56 kg/m     Wt Readings from Last 3 Encounters:  01/27/20 247 lb (112 kg)  10/26/19 248 lb (112.5 kg)  10/22/19 241 lb 3.2 oz (109.4 kg)      General: Alert, oriented x3, no distress, severely obese Head: no evidence of trauma, PERRL, EOMI, no exophtalmos or lid lag, no myxedema, no xanthelasma; normal ears, nose and oropharynx Neck: normal jugular venous pulsations and no hepatojugular reflux; brisk carotid pulses without delay and no carotid bruits Chest: clear to auscultation, no signs of consolidation by percussion or palpation, normal fremitus, symmetrical and full respiratory excursions Cardiovascular: normal position and quality of the apical impulse, regular rhythm with occasional ectopic beats, normal first and second heart sounds, no murmurs, rubs or gallops Abdomen: no tenderness or distention, no masses by palpation, no abnormal pulsatility or arterial bruits, normal bowel sounds, no hepatosplenomegaly Extremities: no clubbing, cyanosis or edema; 2+ radial, ulnar and brachial pulses bilaterally; 2+ right femoral, posterior tibial and dorsalis pedis pulses; 2+ left femoral, posterior tibial and dorsalis pedis pulses; no subclavian or femoral bruits Neurological: grossly nonfocal Psych: Normal mood and affect   ASSESSMENT:    1. Paroxysmal atrial fibrillation (HCC)   2. Nonischemic cardiomyopathy ([place])   3. Hypercholesterolemia   4. Class 2 severe obesity with  serious comorbidity and body mass index (BMI) of 38.0 to 38.9 in adult, unspecified obesity type ([place])   5. Long term (current) use of anticoagulants    PLAN:    In order of problems listed above:  1. AFib: Clinically his had an excellent response to ablation.  He had recurrence of atrial fibrillation in the perioperative period and as before is really not aware of palpitations.  Cannot be sure that he is not having asymptomatic episodes of atrial fibrillation and I think he should remain on anticoagulants.  At this point CHA2DS2-VASc score is only 1 (age), at most 2 if we count his history of resolved left ventricular systolic dysfunction.  Would recommend continuing anticoagulants. 2. 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 3. HLP: LDL is not quite at target, but he has not been able to tolerate higher doses or more effective statins. 4. Severe obesity: He does not have symptoms of daytime hypersomnolence or known obstructive sleep apnea.  Discussed the fact that weight loss would be highly beneficial and reduce the future prevalence of arrhythmia..  Reviewed the impact of weight on the burden of atrial fibrillation, development of diabetes, adverse lipid profile and vascular complications. 5. Anticoagulation: Well-tolerated, without bleeding complications.   Medication Adjustments/Labs and Tests Ordered: Current medicines are reviewed at length with the patient today.  Concerns regarding medicines are outlined above. Labs and tests ordered and medication changes are outlined in the patient instructions below:  Patient Instructions  Medication Instructions:  No changes *If you need a refill on your cardiac medications before your next appointment, please call your pharmacy*   Lab Work: None ordered If you have labs (blood work) drawn today and your tests are completely normal, you will  receive your results only by: [name] MyChart Message (if you have MyChart) OR . A paper copy in the mail If you have any lab test that is abnormal or we need to change your treatment, we will call you to review the results.   Testing/Procedures: None ordered   Follow-Up: At [facility], you and your health needs are our priority.  As part of our continuing mission to provide you with exceptional heart care, we have created designated Provider Care Teams.  These Care Teams include your primary Cardiologist (physician) and Advanced Practice Providers (APPs -  Physician Assistants and Nurse Practitioners) who all work together to provide you with the care you need, when you need it.  We recommend signing up for the patient portal called "MyChart".  Sign up information is provided on this After Visit Summary.  MyChart is used to connect with patients for Virtual Visits (Telemedicine).  Patients are able to view lab/test results, encounter notes, upcoming appointments, etc.  Non-urgent messages can be sent to your provider as well.   To learn more about what you can do with MyChart, go to [email or web address].    Your next appointment:   12 month(s)  The format for your next appointment:   In Person  Provider:   You may see Sanda Klein, MD or one of the following Advanced Practice Providers on your designated Care Team:    Almyra Deforest, PA-C  Fabian Sharp, [place] or   Roby Lofts, PA-C      Signed, Sanda Klein, MD  01/27/2020 8:52 AM    [place]

## 2020-02-18 ENCOUNTER — Other Ambulatory Visit: Payer: Self-pay

## 2020-02-18 ENCOUNTER — Ambulatory Visit
Admission: EM | Admit: 2020-02-18 | Discharge: 2020-02-18 | Disposition: A | Discharge status: Home / Self Care | Payer: Medicare Other | Attending: Family Medicine | Admitting: Family Medicine

## 2020-02-18 ENCOUNTER — Ambulatory Visit (INDEPENDENT_AMBULATORY_CARE_PROVIDER_SITE_OTHER): Payer: Medicare Other

## 2020-02-18 DIAGNOSIS — R059 Cough, unspecified: Secondary | ICD-10-CM

## 2020-02-18 DIAGNOSIS — R21 Rash and other nonspecific skin eruption: Secondary | ICD-10-CM | POA: Diagnosis not present

## 2020-02-18 DIAGNOSIS — J22 Unspecified acute lower respiratory infection: Secondary | ICD-10-CM | POA: Diagnosis not present

## 2020-02-18 DIAGNOSIS — R509 Fever, unspecified: Secondary | ICD-10-CM

## 2020-02-18 MED ORDER — BENZONATATE 100 MG PO CAPS: 200.0000 mg | ORAL_CAPSULE | Freq: Three times a day (TID) | ORAL | 0 refills | Status: DC | PRN

## 2020-02-18 MED ORDER — DOXYCYCLINE HYCLATE 100 MG PO CAPS: 100.0000 mg | ORAL_CAPSULE | Freq: Two times a day (BID) | ORAL | 0 refills | Status: DC

## 2020-02-18 NOTE — ED Triage Notes (Signed)
Patient presents to Urgent Care with complaints of nasal congestion, sore throat, and chills, productive cough since Saturday. Pt treating with OTC Nyquil with no relief.

## 2020-02-18 NOTE — ED Provider Notes (Signed)
[name]    CSN: [id number] Arrival date & time: 02/18/20  1419      History   Chief Complaint Chief Complaint  Patient presents with  . Nasal Congestion  . Sore Throat  . Chills    HPI [name] [name] is a 70 y.o. male.   HPI  Patient presents today with 5 days of nasal congestion, persistent coughing, chills and fever along with sore throat.  Patient unaware of any close contact with anyone else who has been sick.  He denies any shortness of breath.  He endorses fatigue and diminished appetite.  Patient has history of atrial fib denies any palpitations or chest pain.  Past Medical History:  Diagnosis Date  . Cancer ([place]) 2015   melanoma removed from back  . GERD (gastroesophageal reflux disease)   . History of cardiac dysrhythmia 2018   ablation and cardioversion done  . Hyperlipidemia   . Nonischemic cardiomyopathy ([place])   . Scab    right elbow healing  . Umbilical hernia     Patient Active Problem List   Diagnosis Date Noted  . Paroxysmal atrial fibrillation ([place]) 09/07/2016  . Family history of coronary artery disease in brother 07/03/2016  . Nonischemic cardiomyopathy ([place]) 06/08/2016  . Hyperglycemia 06/08/2016  . Atrial fibrillation ([place]) 06/08/2016  . Hypercholesterolemia 06/08/2016  . Moderate obesity 06/08/2016    Past Surgical History:  Procedure Laterality Date  . ATRIAL FIBRILLATION ABLATION N/A 09/07/2016   Procedure: Atrial Fibrillation Ablation;  Surgeon: Constance Haw, MD;  Location: [facility];  Service: Cardiovascular;  Laterality: N/A;  . CARDIOVERSION N/A 07/23/2016   Procedure: CARDIOVERSION;  Surgeon: Sanda Klein, MD;  Location: [facility];  Service: Cardiovascular;  Laterality: N/A;  . CARDIOVERSION N/A 10/03/2016   Procedure: CARDIOVERSION;  Surgeon: Josue Hector, MD;  Location: [facility];  Service: Cardiovascular;  Laterality: N/A;  . HERNIA REPAIR  at birth  . INGUINAL HERNIA REPAIR Bilateral  10/22/2019   Procedure: LAPAROSCOPIC BILATERAL INGUINAL HERNIA REPAIR WITH MESH;  Surgeon: Kinsinger, Arta Bruce, MD;  Location: [place];  Service: General;  Laterality: Bilateral;  . UMBILICAL HERNIA REPAIR N/A 10/22/2019   Procedure: HERNIA REPAIR UMBILICAL ADULT;  Surgeon: Kinsinger, Arta Bruce, MD;  Location: [facility];  Service: General;  Laterality: N/A;       Home Medications    Prior to Admission medications   Medication Sig Start Date End Date Taking? Authorizing Provider  Cholecalciferol (VITAMIN D3) 75 MCG (3000 UT) TABS Take by mouth. 50 mg daily    [provider]  doxycycline (VIBRAMYCIN) 100 MG capsule Take 1 capsule (100 mg total) by mouth 2 (two) times daily. 12/03/19   Tasia Catchings, Amy V, PA-C  lovastatin (MEVACOR) 10 MG tablet Take 10 mg by mouth daily.    [provider]  omeprazole (PRILOSEC) 20 MG capsule Take 20 mg by mouth daily.    [provider]  OVER THE COUNTER MEDICATION Allergy relief dipenhydramine 25 mg in am    [provider]  rivaroxaban (XARELTO) 20 MG TABS tablet Take 1 tablet (20 mg total) by mouth daily with supper. 11/04/19   Croitoru, Dani Gobble, MD  UNABLE TO FIND Med Name: calcium 600 mg daily    [provider]    Family History Family History  Problem Relation Age of Onset  . Heart Problems Father   . Alzheimer's disease Sister   . Stroke Brother   . Heart Problems Brother   .  Heart Problems Brother     Social History Social History   Tobacco Use  . Smoking status: Never Smoker  . Smokeless tobacco: Never Used  Vaping Use  . Vaping Use: Never used  Substance Use Topics  . Alcohol use: Yes    Comment: 1-2 beers per day  . Drug use: No     Allergies   Patient has no known allergies.   Review of Systems Review of Systems Pertinent negatives listed in HPI   Physical Exam Triage Vital Signs ED Triage Vitals  Enc Vitals Group     BP 02/18/20 1502 (!)  143/86     Pulse Rate 02/18/20 1502 76     Resp 02/18/20 1502 16     Temp 02/18/20 1502 98.8 F (37.1 C)     Temp Source 02/18/20 1502 Oral     SpO2 02/18/20 1502 95 %     Weight 02/18/20 1447 240 lb (108.9 kg)     Height --      Head Circumference --      Peak Flow --      Pain Score 02/18/20 1447 8     Pain Loc --      Pain Edu? --      Excl. in [place]? --    No data found.  Updated Vital Signs BP (!) 143/86 (BP Location: Left Arm)   Pulse 76   Temp 98.8 F (37.1 C) (Oral)   Resp 16   Wt 240 lb (108.9 kg)   SpO2 95%   BMI 36.49 kg/m   Visual Acuity Right Eye Distance:   Left Eye Distance:   Bilateral Distance:    Right Eye Near:   Left Eye Near:    Bilateral Near:     Physical Exam General appearance: alert, Ill-appearing, no distress Head: Normocephalic, without obvious abnormality, atraumatic ENT: External ears normal,  mucosal edema, congestion, oropharynx w/o exudate Respiratory: Respirations even , unlabored, coarse lung sound, Negative rales or  wheezing Heart: rate and rhythm normal. No gallop or murmurs noted on exam  Abdomen: BS +, no distention, no rebound tenderness, or no mass Extremities: No gross deformities Skin: Skin color, texture, turgor normal. No rashes seen  Psych: Appropriate mood and affect. Neurologic: Alert, oriented to person, place, and time, thought content appropriate. UC Treatments / Results  Labs (all labs ordered are listed, but only abnormal results are displayed) Labs Reviewed  NOVEL CORONAVIRUS, NAA    EKG   Radiology No results found.  Procedures Procedures (including critical care time)  Medications Ordered in UC Medications - No data to display  Initial Impression / Assessment and Plan / UC Course  I have reviewed the triage vital signs and the nursing notes.  Pertinent labs & imaging results that were available during my care of the patient were reviewed by me and considered in my medical decision making (see  chart for details).    COVID-19 test is pending chest x-ray is negative for any pneumonia.  Given overall presentation the patient and his history of recurrent bronchitis will cover with doxycycline benzonatate for cough.  Red flags warranting immediate evaluation in the setting of the ER discussed.  PCP follow-up as needed. Final Clinical Impressions(s) / UC Diagnoses   Final diagnoses:  Lower respiratory infection (e.g., bronchitis, pneumonia, pneumonitis, pulmonitis)     Discharge Instructions     Your Covid test will result within 3 to 5 days.  If it is abnormal we will notify you  by phone.  Start all medication as prescribed.  If you develop any symptoms of shortness of breath, chest pain, or racing heart rate go immediately to the closest emergency department.    ED Prescriptions    Medication Sig Dispense Auth. Provider   doxycycline (VIBRAMYCIN) 100 MG capsule Take 1 capsule (100 mg total) by mouth 2 (two) times daily. 20 capsule Scot Jun, FNP   benzonatate (TESSALON) 100 MG capsule Take 2 capsules (200 mg total) by mouth 3 (three) times daily as needed for cough. 40 capsule Scot Jun, FNP     PDMP not reviewed this encounter.   Scot Jun, FNP 02/19/20 [phone]

## 2020-02-18 NOTE — Discharge Instructions (Signed)
Your Covid test will result within 3 to 5 days.  If it is abnormal we will notify you by phone.  Start all medication as prescribed.  If you develop any symptoms of shortness of breath, chest pain, or racing heart rate go immediately to the closest emergency department.

## 2020-02-19 LAB — SARS-COV-2, NAA 2 DAY TAT

## 2020-02-19 LAB — NOVEL CORONAVIRUS, NAA: SARS-CoV-2, NAA: DETECTED — AB

## 2020-04-15 DIAGNOSIS — K219 Gastro-esophageal reflux disease without esophagitis: Secondary | ICD-10-CM | POA: Diagnosis not present

## 2020-04-15 DIAGNOSIS — D6869 Other thrombophilia: Secondary | ICD-10-CM | POA: Diagnosis not present

## 2020-04-15 DIAGNOSIS — Z0000 Encounter for general adult medical examination without abnormal findings: Secondary | ICD-10-CM | POA: Diagnosis not present

## 2020-04-15 DIAGNOSIS — G4700 Insomnia, unspecified: Secondary | ICD-10-CM | POA: Diagnosis not present

## 2020-04-15 DIAGNOSIS — E7800 Pure hypercholesterolemia, unspecified: Secondary | ICD-10-CM | POA: Diagnosis not present

## 2020-04-15 DIAGNOSIS — I4891 Unspecified atrial fibrillation: Secondary | ICD-10-CM | POA: Diagnosis not present

## 2020-04-15 DIAGNOSIS — Z125 Encounter for screening for malignant neoplasm of prostate: Secondary | ICD-10-CM | POA: Diagnosis not present

## 2020-04-15 DIAGNOSIS — R7309 Other abnormal glucose: Secondary | ICD-10-CM | POA: Diagnosis not present

## 2020-06-07 DIAGNOSIS — M5441 Lumbago with sciatica, right side: Secondary | ICD-10-CM | POA: Diagnosis not present

## 2020-06-23 DIAGNOSIS — M5441 Lumbago with sciatica, right side: Secondary | ICD-10-CM | POA: Diagnosis not present

## 2020-06-24 DIAGNOSIS — M5441 Lumbago with sciatica, right side: Secondary | ICD-10-CM | POA: Diagnosis not present

## 2020-06-29 DIAGNOSIS — M5441 Lumbago with sciatica, right side: Secondary | ICD-10-CM | POA: Diagnosis not present

## 2020-06-30 DIAGNOSIS — M5441 Lumbago with sciatica, right side: Secondary | ICD-10-CM | POA: Diagnosis not present

## 2020-07-05 DIAGNOSIS — M5441 Lumbago with sciatica, right side: Secondary | ICD-10-CM | POA: Diagnosis not present

## 2020-07-06 DIAGNOSIS — M5441 Lumbago with sciatica, right side: Secondary | ICD-10-CM | POA: Diagnosis not present

## 2020-07-12 DIAGNOSIS — M5441 Lumbago with sciatica, right side: Secondary | ICD-10-CM | POA: Diagnosis not present

## 2020-07-14 DIAGNOSIS — M5441 Lumbago with sciatica, right side: Secondary | ICD-10-CM | POA: Diagnosis not present

## 2020-08-01 ENCOUNTER — Other Ambulatory Visit: Payer: Self-pay | Admitting: Sports Medicine

## 2020-08-01 DIAGNOSIS — M5441 Lumbago with sciatica, right side: Secondary | ICD-10-CM

## 2020-08-04 DIAGNOSIS — M5441 Lumbago with sciatica, right side: Secondary | ICD-10-CM | POA: Diagnosis not present

## 2020-08-10 DIAGNOSIS — M5441 Lumbago with sciatica, right side: Secondary | ICD-10-CM | POA: Diagnosis not present

## 2020-08-12 DIAGNOSIS — M5441 Lumbago with sciatica, right side: Secondary | ICD-10-CM | POA: Diagnosis not present

## 2020-08-15 DIAGNOSIS — M5441 Lumbago with sciatica, right side: Secondary | ICD-10-CM | POA: Diagnosis not present

## 2020-08-16 ENCOUNTER — Other Ambulatory Visit: Payer: Self-pay

## 2020-08-16 ENCOUNTER — Ambulatory Visit
Admission: RE | Admit: 2020-08-16 | Discharge: 2020-08-16 | Disposition: A | Discharge status: Home / Self Care | Payer: Medicare Other | Source: Ambulatory Visit | Attending: Sports Medicine | Admitting: Sports Medicine

## 2020-08-16 DIAGNOSIS — M48061 Spinal stenosis, lumbar region without neurogenic claudication: Secondary | ICD-10-CM | POA: Diagnosis not present

## 2020-08-16 DIAGNOSIS — M545 Low back pain, unspecified: Secondary | ICD-10-CM | POA: Diagnosis not present

## 2020-08-16 DIAGNOSIS — M5441 Lumbago with sciatica, right side: Secondary | ICD-10-CM

## 2020-09-03 DIAGNOSIS — M5441 Lumbago with sciatica, right side: Secondary | ICD-10-CM | POA: Diagnosis not present

## 2020-09-07 ENCOUNTER — Other Ambulatory Visit: Payer: Self-pay | Admitting: Family Medicine

## 2020-09-07 DIAGNOSIS — N644 Mastodynia: Secondary | ICD-10-CM | POA: Diagnosis not present

## 2020-09-09 DIAGNOSIS — M5416 Radiculopathy, lumbar region: Secondary | ICD-10-CM | POA: Diagnosis not present

## 2020-09-14 IMAGING — CR DG SHOULDER 2+V*L*
3 series · 3 of 3 positions shown · non-contrast
Comparison: Chest radiographs 05/30/2016.

CLINICAL DATA: 67-year-old male status post fall 1 month ago with
continued pain and decreased range of motion.

EXAM:
LEFT SHOULDER - 2+ VIEW

[w shoulder ap internal left]
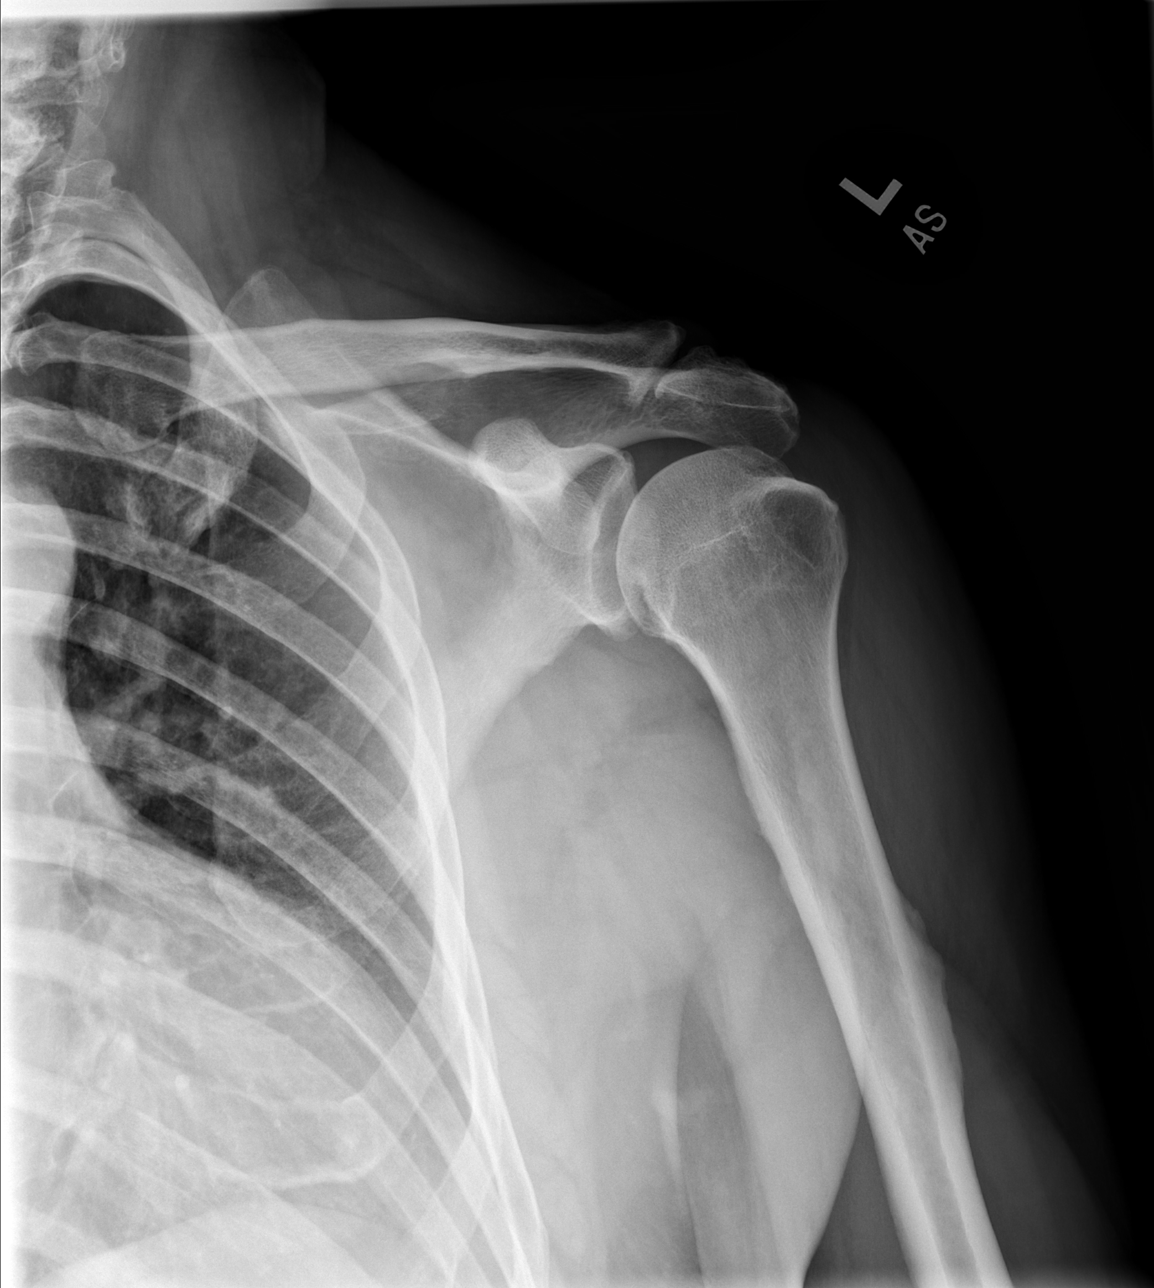

[w shoulder y view left *]
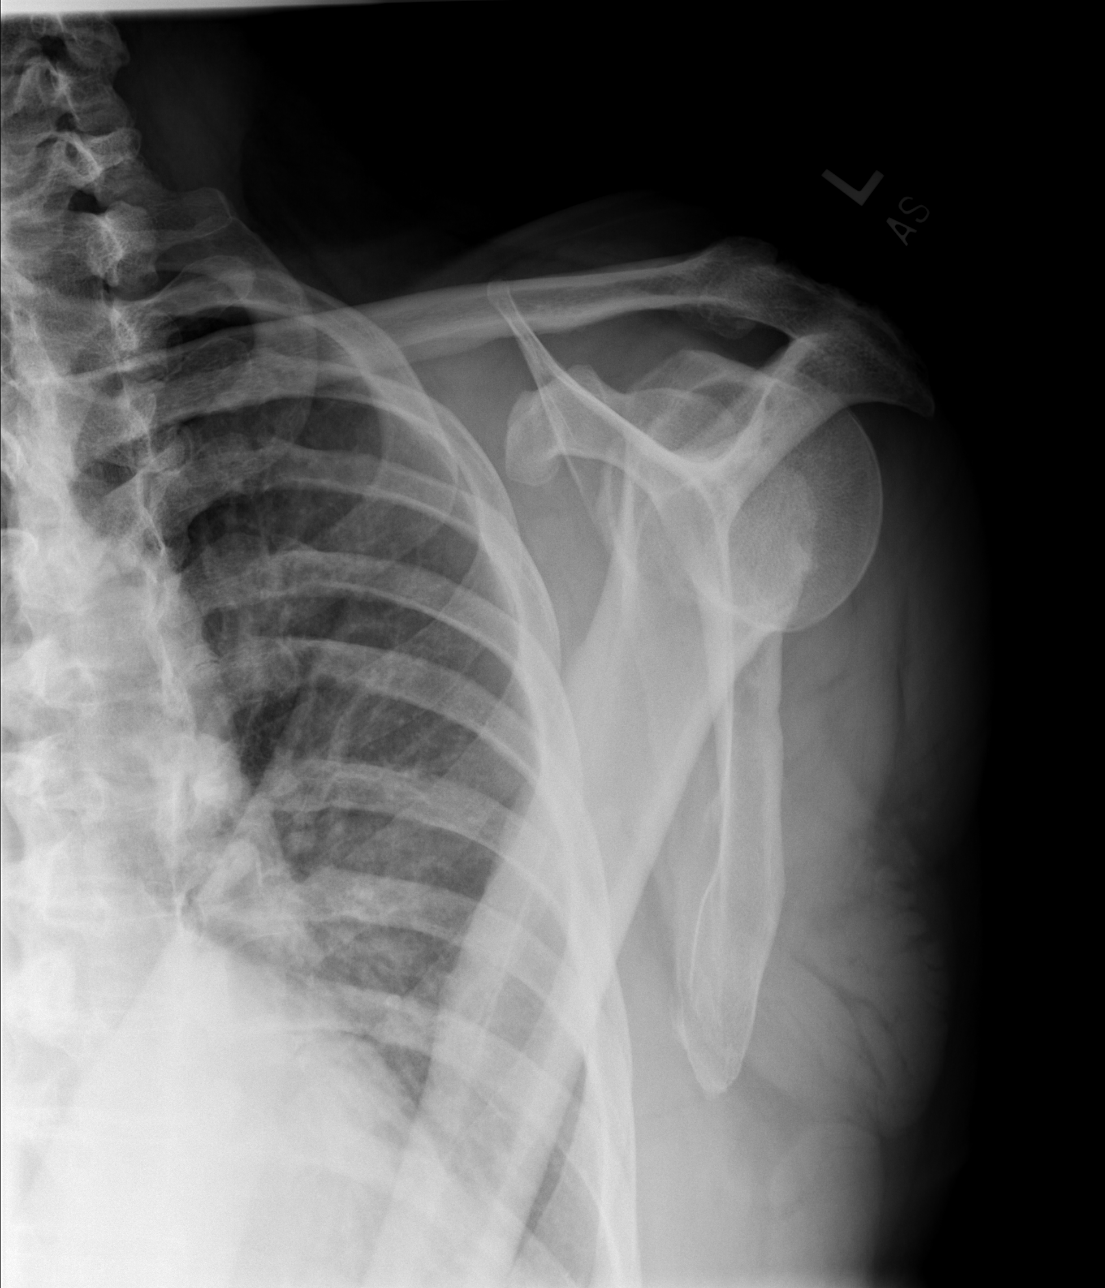

[w shoulder axillary left *]
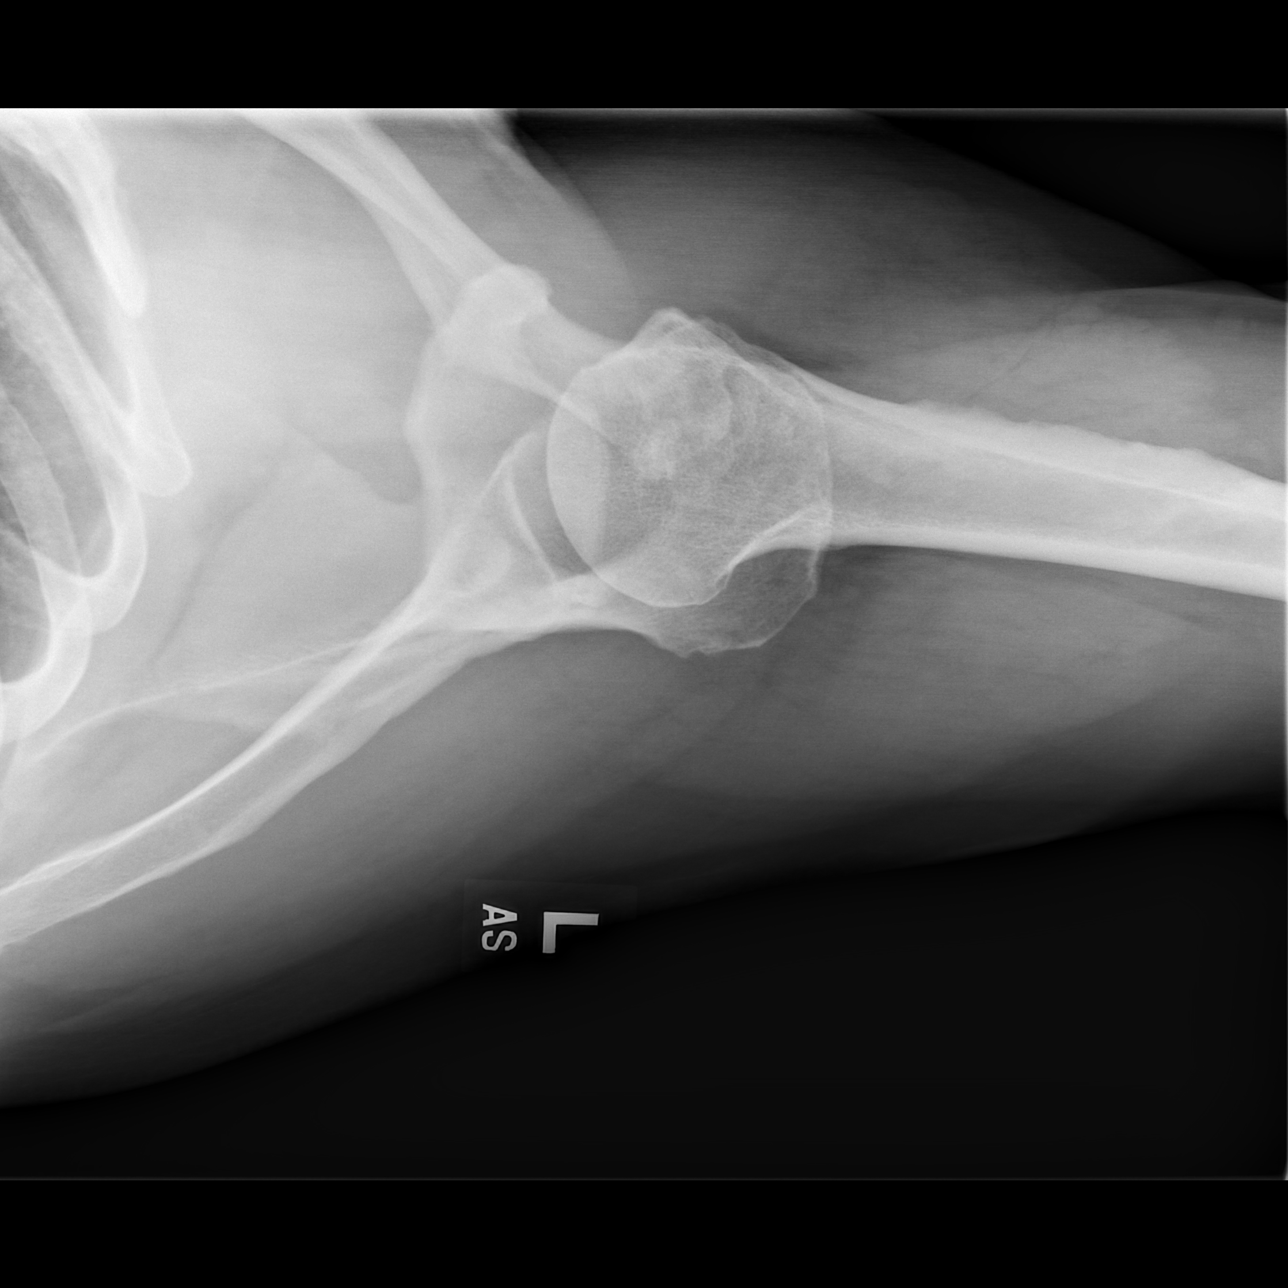

[3 of 3 positions shown; findings below may reference images not displayed]

FINDINGS: Bone mineralization is within normal limits for age. No glenohumeral
joint dislocation. Proximal left humerus appears intact. Chronic
degenerative spurring at the left AC joint. Left clavicle and
scapula appear intact. Negative visible left ribs and lung
parenchyma.
IMPRESSION: 1. No acute or subacute fracture or dislocation identified about the
left shoulder.
2. Chronic left AC joint degeneration.

## 2020-10-05 DIAGNOSIS — M2550 Pain in unspecified joint: Secondary | ICD-10-CM | POA: Diagnosis not present

## 2020-10-12 ENCOUNTER — Other Ambulatory Visit: Payer: Medicare Other

## 2020-10-13 DIAGNOSIS — M5416 Radiculopathy, lumbar region: Secondary | ICD-10-CM | POA: Diagnosis not present

## 2020-10-17 DIAGNOSIS — D6869 Other thrombophilia: Secondary | ICD-10-CM | POA: Diagnosis not present

## 2020-10-17 DIAGNOSIS — Z23 Encounter for immunization: Secondary | ICD-10-CM | POA: Diagnosis not present

## 2020-10-17 DIAGNOSIS — I4891 Unspecified atrial fibrillation: Secondary | ICD-10-CM | POA: Diagnosis not present

## 2020-10-17 DIAGNOSIS — M5430 Sciatica, unspecified side: Secondary | ICD-10-CM | POA: Diagnosis not present

## 2020-10-17 DIAGNOSIS — E7800 Pure hypercholesterolemia, unspecified: Secondary | ICD-10-CM | POA: Diagnosis not present

## 2020-10-17 DIAGNOSIS — R7309 Other abnormal glucose: Secondary | ICD-10-CM | POA: Diagnosis not present

## 2020-10-20 ENCOUNTER — Telehealth: Payer: Self-pay

## 2020-10-20 ENCOUNTER — Other Ambulatory Visit: Payer: Self-pay | Admitting: Neurosurgery

## 2020-10-20 DIAGNOSIS — M5126 Other intervertebral disc displacement, lumbar region: Secondary | ICD-10-CM | POA: Diagnosis not present

## 2020-10-20 DIAGNOSIS — R030 Elevated blood-pressure reading, without diagnosis of hypertension: Secondary | ICD-10-CM | POA: Diagnosis not present

## 2020-10-20 DIAGNOSIS — Z6837 Body mass index (BMI) 37.0-37.9, adult: Secondary | ICD-10-CM | POA: Diagnosis not present

## 2020-10-20 NOTE — Telephone Encounter (Signed)
Patient with diagnosis of A Fib on Xarelto for anticoagulation.    Procedure:  Lumbar Microdiscectomy    Date of procedure: 10/25/20   CHA2DS2-VASc Score = 1  This indicates a 0.6% annual risk of stroke. The patient's score is based upon: CHF History: 0 HTN History: 0 Diabetes History: 0 Stroke History: 0 Vascular Disease History: 0 Age Score: 1 Gender Score: 0      CrCl 83 mL/min Platelet count: none recent  Recommend patient hold Xarelto 3 days prior to procedure.  Unclear if patient is taking Xarelto.  If he is, recommend taking tonight and tomorrow night and then begin holding.

## 2020-10-20 NOTE — Telephone Encounter (Signed)
   [facility] Pre-operative Risk Assessment    Patient Name: [name] [name]  DOB: 1950/01/23 MRN: [id number]  HEARTCARE STAFF:  - IMPORTANT!!!!!! Under Visit Info/Reason for Call, type in Other and utilize the format Clearance MM/DD/YY or Clearance TBD. Do not use dashes or single digits. - Please review there is not already an duplicate clearance open for this procedure. - If request is for dental extraction, please clarify the # of teeth to be extracted. - If the patient is currently at the dentist's office, call Pre-Op Callback Staff (MA/nurse) to input urgent request.  - If the patient is not currently in the dentist office, please route to the Pre-Op pool.  Request for surgical clearance:  What type of surgery is being performed? Lumbar Microdiscectomy   When is this surgery scheduled? 10/25/20  What type of clearance is required (medical clearance vs. Pharmacy clearance to hold med vs. Both)?  Both   Are there any medications that need to be held prior to surgery and how long? Xarelto, "Patient indicated that e did not restart his Xarelto since injection"  Practice name and name of physician performing surgery? [facility], Dr. Consuella Lose  What is the office phone number? [phone]   7.   What is the office fax number? [phone]  8.   Anesthesia type (None, local, MAC, general) ? General    Jacqulynn Cadet 10/20/2020, 2:34 PM  _________________________________________________________________   (provider comments below)

## 2020-10-20 NOTE — Telephone Encounter (Signed)
   Name: [name] [name]  DOB: [birth year]  MRN: [id number]   Primary Cardiologist: Sanda Klein, MD  Chart reviewed as part of pre-operative protocol coverage. Patient was contacted 10/20/2020 in reference to pre-operative risk assessment for pending surgery as outlined below.  [name] [name] was last seen 01/2020 by Dr. Sallyanne Kuster, chart reviewed. 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 Last echo 11/2019 with normalized LVEF. No hx of CAD. Had ETT 2018 with MD result note indicating no ischemia. Revised cardiac risk index is 0.9% (if including hx of cardiomyopathy which is actually now resolved) - indicating patient is low risk for procedure. I reached out to patient for update on how he is doing. The patient affirms he has been doing well without any new cardiac symptoms. No CP or SOB. Able to exert 4 METS without CP or SOB. No recent Afib or stroke issues. Main limitation is back problems. Therefore, based on ACC/AHA guidelines, the patient would be at acceptable risk for the planned procedure without further cardiovascular testing. The patient was advised that if he develops new symptoms prior to surgery to contact our office to arrange for a follow-up visit, and he verbalized understanding.  Regarding anticoagulation - per review with pharmacy team, recommend patient hold Xarelto 3 days prior to procedure. The patient states he has not been on his Xarelto since a previous back injection last Thursday. He states Dr. Jackie Plum office had told him to stop prior to the steroid injection. Our office did not received any requests around that time. He does not recall anyone telling him to restart. We suggest to minimize the amount of time he is off this medication. It would be our preference, per pharmacist, to recommend he restart Xarelto tonight and tomorrow night and then begin holding 10/8 in preparation for his surgery below.   We need to ensure  that primary care did not hold his Xarelto for other reasons. I will route to callback please call Dr. Raul Del office ASAP to inquire if they are OK with patient resuming Xarelto for tonight/tomorrow as above, then begin holding for back surgery - callback team, after this information is known, please notify the patient as well.  Otherwise, I will route this recommendation to the requesting party via Epic fax function and remove from pre-op pool. Please call with questions.  Charlie Pitter, PA-C 10/20/2020, 3:47 PM

## 2020-10-20 NOTE — Telephone Encounter (Signed)
Covering preop today. Will route to pharm for formal input on anticoag - please note this states "patient indicated he did not restart his Xarelto since injection" - not sure if he had been holding from a prior clearance that is not on file with [place]. Regardless, appreciate input for whether to restart in interim or how long to hold before procedure then pt will need call.

## 2020-10-20 NOTE — Telephone Encounter (Signed)
I called Dr. Raul Del office in regards to Xarelto. Per Melina Copa, PAC need to see if Dr. Brigitte Pulse kept pt off Xarelto longer for another reason. Dr. Brigitte Pulse is gone for the night. Left message for her to please call our office back and ask to s/w the pre op team.   I then reached out to the pt and advised him to restart Xarelto tonight and tomorrow. Pt will be gin holding Xarelto Saturday 10/22/20 to hold x 3 days prior to back surgery scheduled for 10/25/20. Pt is aware of this. I assured the pt that if I happen to hear something different from Dr. Brigitte Pulse we will let him know. I explained to the pt the longer he is off his Xarelto the higher risk  he is at for stroke. Pt thanked me for the call and the help. I will update the pr op provider with this information.

## 2020-10-21 ENCOUNTER — Other Ambulatory Visit (HOSPITAL_COMMUNITY)
Admission: RE | Admit: 2020-10-21 | Discharge: 2020-10-21 | Disposition: A | Discharge status: Home / Self Care | Payer: Medicare Other | Source: Ambulatory Visit | Attending: Neurosurgery | Admitting: Neurosurgery

## 2020-10-21 DIAGNOSIS — Z20822 Contact with and (suspected) exposure to covid-19: Secondary | ICD-10-CM | POA: Insufficient documentation

## 2020-10-21 DIAGNOSIS — Z01812 Encounter for preprocedural laboratory examination: Secondary | ICD-10-CM | POA: Insufficient documentation

## 2020-10-21 LAB — SARS CORONAVIRUS 2 (TAT 6-24 HRS): SARS Coronavirus 2: NEGATIVE

## 2020-10-21 NOTE — Telephone Encounter (Signed)
Malachy Moan, one of our operators called me and relayed a message to our office in regards to Xarelto that was stopped for injection last week, see previous notes. Per message received to me from Dr. Raul Del office was that Dr. Brigitte Pulse is not the one who stopped his Xarelto for the injection and that it was Dr. Sallyanne Kuster. I thanked Doren Custard for relaying the message to me from Dr. Raul Del office. I will update the pre op provider today.   See notes from yesterday where I s/w the pt and he has resume his Xarelto last night per advice from our office. Pt started back on Xarelto last night, will take today and will then stop beginning Saturday 10/8 to hold x 3 days for upcoming back surgery. Pt will resume Xarelto after the back surgery once felt safe by the surgeon, pt aware.

## 2020-10-21 NOTE — Telephone Encounter (Signed)
It appears clearance letter was sent to the surgeon's office yesterday based on routing history. The holding time for anticoagulation is clear and has been relayed to the patient. Will remove this preop clearance from the pool.

## 2020-10-23 ENCOUNTER — Encounter (HOSPITAL_COMMUNITY): Payer: Self-pay | Admitting: Neurosurgery

## 2020-10-23 NOTE — Progress Notes (Signed)
PCP - Serita Grammes Cardiologist - croitoru EKG - dos Chest x-ray - 02-18-20 ECHO - 11-18-19 Cardiac Cath - na CPAP - na   Blood Thinner Instructions:  xarelto stopped 10/22/20 Aspirin Instructions: na    COVID TEST- 10/21/20  negative  Anesthesia review: cardiac hx./clearance note in epic  -------------  SDW INSTRUCTIONS:  Your procedure is scheduled on 10/25/20. Please report to [facility] Main Entrance "A" at 1030 A.M., and check in at the Admitting office. Call this number if you have problems the morning of surgery: [phone]   Remember: Do not eat or drink after midnight the night before your surgery     Medications to take morning of surgery with a sip of water include: Lovastatin,omeprazole,lyrica  As of today, STOP taking any Aspirin (unless otherwise instructed by your surgeon), Aleve, Naproxen, Ibuprofen, Motrin, Advil, Goody's, BC's, all herbal medications, fish oil, and all vitamins.    The Morning of Surgery Do not wear jewelry, make-up or nail polish. Do not wear lotions, powders, or perfumes/colognes, or deodorant Men may shave face and neck. Do not bring valuables to the hospital. [facility] is not responsible for any belongings or valuables.  If you are a smoker, DO NOT Smoke 24 hours prior to surgery  If you wear a CPAP at night please bring your mask the morning of surgery   Remember that you must have someone to transport you home after your surgery, and remain with you for 24 hours if you are discharged the same day.  Please bring cases for contacts, glasses, hearing aids, dentures or bridgework because it cannot be worn into surgery.   Patients discharged the day of surgery will not be allowed to drive home.   Please shower the NIGHT BEFORE/MORNING OF SURGERY (use antibacterial soap like DIAL soap if possible). Wear comfortable clothes the morning of surgery. Oral Hygiene is also important to reduce your risk of infection.  Remember - BRUSH  YOUR TEETH THE MORNING OF SURGERY WITH YOUR REGULAR TOOTHPASTE  Patient denies shortness of breath, fever, cough and chest pain.

## 2020-10-24 NOTE — Anesthesia Preprocedure Evaluation (Addendum)
Anesthesia Evaluation  Patient identified by MRN, date of birth, ID band Patient awake    Reviewed: Allergy & Precautions, NPO status , Patient's Chart, lab work & pertinent test results  Airway Mallampati: III  TM Distance: >3 FB Neck ROM: Full    Dental  (+) Teeth Intact, Dental Advisory Given   Pulmonary neg pulmonary ROS,    breath sounds clear to auscultation       Cardiovascular negative cardio ROS   Rhythm:Regular Rate:Normal     Neuro/Psych negative neurological ROS     GI/Hepatic GERD  Medicated and Controlled,  Endo/Other    Renal/GU negative Renal ROS     Musculoskeletal   Abdominal   Peds  Hematology   Anesthesia Other Findings   Reproductive/Obstetrics                           Anesthesia Physical Anesthesia Plan  ASA: 3  Anesthesia Plan: General   Post-op Pain Management:    Induction: Intravenous  PONV Risk Score and Plan: 3 and Ondansetron, Dexamethasone and Midazolam  Airway Management Planned: Oral ETT  Additional Equipment:   Intra-op Plan:   Post-operative Plan: Extubation in OR  Informed Consent: I have reviewed the patients History and Physical, chart, labs and discussed the procedure including the risks, benefits and alternatives for the proposed anesthesia with the patient or authorized representative who has indicated his/her understanding and acceptance.     Dental advisory given  Plan Discussed with: CRNA and Anesthesiologist  Anesthesia Plan Comments: (See APP note by Durel Salts, FNP )       Anesthesia Quick Evaluation

## 2020-10-24 NOTE — Progress Notes (Signed)
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 Last office visit 01/27/20. Pt cleared for surgery at low risk on 10/20/20 by Melina Copa, PA   LABS: Will be obtained day of surgery     IMAGES: CXR 02/18/20: No active cardiopulmonary disease   Aorta screening US 08/06/18: No aneurysm identified.   EKG 11/04/20: sinus rhythm with PVCs and a left anterior fascicular block.   CV: Echo 11/18/19:  1. Left ventricular ejection fraction, by estimation, is 60 to 65%. The left ventricle has normal function. The left ventricle has no regional wall motion abnormalities. Left ventricular diastolic parameters are consistent with Grade II diastolic dysfunction (pseudonormalization). Elevated left ventricular end-diastolic pressure. The average left ventricular global longitudinal strain is -24.7 %. The global longitudinal strain is normal.  2. Right ventricular systolic function is normal. The right ventricular size is normal. There is normal pulmonary artery systolic pressure.  3. The mitral valve is normal in structure. Trivial mitral valve regurgitation. No evidence of mitral stenosis.  4. The aortic valve is tricuspid. Aortic valve regurgitation is not visualized. No aortic stenosis is present.  5. Aortic dilatation noted. There is mild dilatation of the ascending aorta, measuring 41 mm.  6. The  inferior vena cava is normal in size with greater than 50% respiratory variability, suggesting right atrial pressure of 3 mmHg.     Past Medical History:  Diagnosis Date   Arthritis    Cancer ([place]) 2015   melanoma removed from back   GERD (gastroesophageal reflux disease)    History of cardiac dysrhythmia 2018   ablation and cardioversion done   Hyperlipidemia    Nonischemic cardiomyopathy ([place])    Scab    right elbow healing   Umbilical hernia     Past Surgical History:  Procedure Laterality Date   ATRIAL FIBRILLATION ABLATION N/A 09/07/2016   Procedure: Atrial Fibrillation Ablation;  Surgeon: Constance Haw, MD;  Location: [facility];  Service: Cardiovascular;  Laterality: N/A;   CARDIOVERSION N/A 07/23/2016   Procedure: CARDIOVERSION;  Surgeon: Sanda Klein, MD;  Location: [facility];  Service: Cardiovascular;  Laterality: N/A;   CARDIOVERSION N/A 10/03/2016   Procedure: CARDIOVERSION;  Surgeon: Josue Hector, MD;  Location: [facility];  Service: Cardiovascular;  Laterality: N/A;   HERNIA REPAIR  at birth   [place] Bilateral 10/22/2019   Procedure: LAPAROSCOPIC BILATERAL INGUINAL HERNIA REPAIR WITH MESH;  Surgeon: Kinsinger, Arta Bruce, MD;  Location: [place];  Service: General;  Laterality: Bilateral;   UMBILICAL HERNIA REPAIR N/A 10/22/2019   Procedure: HERNIA REPAIR UMBILICAL ADULT;  Surgeon: Kinsinger, Arta Bruce, MD;  Location: [facility];  Service: General;  Laterality: N/A;    MEDICATIONS: No current facility-administered medications for this encounter.    cholecalciferol (VITAMIN D) 25 MCG (1000  UNIT) tablet   lovastatin (MEVACOR) 40 MG tablet   Magnesium 250 MG TABS   omeprazole (PRILOSEC) 20 MG capsule   pregabalin (LYRICA) 100 MG capsule   rivaroxaban (XARELTO) 20 MG TABS tablet    If labs acceptable day of surgery, I anticipate pt can proceed with surgery as scheduled.  Willeen Cass, PhD,  FNP-BC [facility]/Anesthesiology Phone: [phone] 10/24/2020 10:30 AM

## 2020-10-25 ENCOUNTER — Ambulatory Visit (HOSPITAL_COMMUNITY): Payer: Medicare Other | Admitting: Emergency Medicine

## 2020-10-25 ENCOUNTER — Other Ambulatory Visit: Payer: Self-pay

## 2020-10-25 ENCOUNTER — Encounter (HOSPITAL_COMMUNITY): Payer: Self-pay | Admitting: Neurosurgery

## 2020-10-25 ENCOUNTER — Encounter (HOSPITAL_COMMUNITY)
Admission: RE | Disposition: A | Discharge status: Home / Self Care | Payer: Self-pay | Source: Ambulatory Visit | Attending: Neurosurgery

## 2020-10-25 ENCOUNTER — Ambulatory Visit (HOSPITAL_COMMUNITY)
Admission: RE | Admit: 2020-10-25 | Discharge: 2020-10-25 | Disposition: A | Discharge status: Home / Self Care | Payer: Medicare Other | Source: Ambulatory Visit | Attending: Neurosurgery | Admitting: Neurosurgery

## 2020-10-25 ENCOUNTER — Ambulatory Visit (HOSPITAL_COMMUNITY): Payer: Medicare Other

## 2020-10-25 DIAGNOSIS — Z85820 Personal history of malignant melanoma of skin: Secondary | ICD-10-CM | POA: Insufficient documentation

## 2020-10-25 DIAGNOSIS — M5116 Intervertebral disc disorders with radiculopathy, lumbar region: Secondary | ICD-10-CM | POA: Diagnosis not present

## 2020-10-25 DIAGNOSIS — Z888 Allergy status to other drugs, medicaments and biological substances status: Secondary | ICD-10-CM | POA: Insufficient documentation

## 2020-10-25 DIAGNOSIS — Z7901 Long term (current) use of anticoagulants: Secondary | ICD-10-CM | POA: Diagnosis not present

## 2020-10-25 DIAGNOSIS — M5417 Radiculopathy, lumbosacral region: Secondary | ICD-10-CM | POA: Diagnosis not present

## 2020-10-25 DIAGNOSIS — I4819 Other persistent atrial fibrillation: Secondary | ICD-10-CM

## 2020-10-25 DIAGNOSIS — M4727 Other spondylosis with radiculopathy, lumbosacral region: Secondary | ICD-10-CM | POA: Diagnosis not present

## 2020-10-25 DIAGNOSIS — Z419 Encounter for procedure for purposes other than remedying health state, unspecified: Secondary | ICD-10-CM

## 2020-10-25 DIAGNOSIS — I4891 Unspecified atrial fibrillation: Secondary | ICD-10-CM | POA: Insufficient documentation

## 2020-10-25 DIAGNOSIS — M4807 Spinal stenosis, lumbosacral region: Secondary | ICD-10-CM | POA: Insufficient documentation

## 2020-10-25 DIAGNOSIS — Z79899 Other long term (current) drug therapy: Secondary | ICD-10-CM | POA: Diagnosis not present

## 2020-10-25 DIAGNOSIS — I480 Paroxysmal atrial fibrillation: Secondary | ICD-10-CM | POA: Diagnosis not present

## 2020-10-25 DIAGNOSIS — K219 Gastro-esophageal reflux disease without esophagitis: Secondary | ICD-10-CM | POA: Diagnosis not present

## 2020-10-25 DIAGNOSIS — E7800 Pure hypercholesterolemia, unspecified: Secondary | ICD-10-CM | POA: Diagnosis not present

## 2020-10-25 DIAGNOSIS — Z981 Arthrodesis status: Secondary | ICD-10-CM | POA: Diagnosis not present

## 2020-10-25 HISTORY — DX: Unspecified osteoarthritis, unspecified site: M19.90

## 2020-10-25 HISTORY — PX: LUMBAR LAMINECTOMY/DECOMPRESSION MICRODISCECTOMY: SHX5026

## 2020-10-25 HISTORY — DX: Unspecified atrial fibrillation: I48.91

## 2020-10-25 LAB — BASIC METABOLIC PANEL
Anion gap: 8 (ref 5–15)
BUN: 10 mg/dL (ref 8–23)
CO2: 23 mmol/L (ref 22–32)
Calcium: 8.9 mg/dL (ref 8.9–10.3)
Chloride: 103 mmol/L (ref 98–111)
Creatinine, Ser: 0.94 mg/dL (ref 0.61–1.24)
GFR, Estimated: >60 mL/min (ref >60)
Glucose, Bld: 101 mg/dL — ABNORMAL HIGH (ref 70–99)
Potassium: 4.1 mmol/L (ref 3.5–5.1)
Sodium: 134 mmol/L — ABNORMAL LOW (ref 135–145)

## 2020-10-25 LAB — CBC
HCT: 45 % (ref 39.0–52.0)
Hemoglobin: 14.8 g/dL (ref 13.0–17.0)
MCH: 28.5 pg (ref 26.0–34.0)
MCHC: 32.9 g/dL (ref 30.0–36.0)
MCV: 86.7 fL (ref 80.0–100.0)
Platelets: 172 10*3/uL (ref 150–400)
RBC: 5.19 MIL/uL (ref 4.22–5.81)
RDW: 12.2 % (ref 11.5–15.5)
WBC: 8 10*3/uL (ref 4.0–10.5)
nRBC: 0 % (ref 0.0–0.2)

## 2020-10-25 LAB — SURGICAL PCR SCREEN
MRSA, PCR: NEGATIVE
Staphylococcus aureus: NEGATIVE

## 2020-10-25 SURGERY — LUMBAR LAMINECTOMY/DECOMPRESSION MICRODISCECTOMY 1 LEVEL
Anesthesia: General | Site: Back | Laterality: Right

## 2020-10-25 MED ORDER — MIDAZOLAM HCL 5 MG/5ML IJ SOLN
INTRAMUSCULAR | Status: DC | PRN
  Administered 2020-10-25: 2 mg via INTRAVENOUS

## 2020-10-25 MED ORDER — CHLORHEXIDINE GLUCONATE 0.12 % MT SOLN
15.0000 mL | Freq: Once | OROMUCOSAL | Status: AC
  Administered 2020-10-25: 15 mL via OROMUCOSAL
  Filled 2020-10-25: qty 15

## 2020-10-25 MED ORDER — FENTANYL CITRATE (PF) 250 MCG/5ML IJ SOLN
INTRAMUSCULAR | Status: DC | PRN
  Administered 2020-10-25 (×2): 50 ug via INTRAVENOUS
  Administered 2020-10-25: 100 ug via INTRAVENOUS
  Administered 2020-10-25: 50 ug via INTRAVENOUS

## 2020-10-25 MED ORDER — BUPIVACAINE HCL (PF) 0.5 % IJ SOLN
INTRAMUSCULAR | Status: DC | PRN
  Administered 2020-10-25: 5 mL

## 2020-10-25 MED ORDER — CEFAZOLIN SODIUM-DEXTROSE 2-4 GM/100ML-% IV SOLN
2.0000 g | INTRAVENOUS | Status: AC
  Administered 2020-10-25: 2 g via INTRAVENOUS
  Filled 2020-10-25: qty 100

## 2020-10-25 MED ORDER — CHLORHEXIDINE GLUCONATE CLOTH 2 % EX PADS: 6.0000 | MEDICATED_PAD | Freq: Once | CUTANEOUS | Status: DC

## 2020-10-25 MED ORDER — ROCURONIUM BROMIDE 10 MG/ML (PF) SYRINGE
PREFILLED_SYRINGE | INTRAVENOUS | Status: AC
  Filled 2020-10-25: qty 10

## 2020-10-25 MED ORDER — RIVAROXABAN 20 MG PO TABS: 20.0000 mg | ORAL_TABLET | Freq: Every day | ORAL | 3 refills | Status: DC

## 2020-10-25 MED ORDER — PROPOFOL 10 MG/ML IV BOLUS
INTRAVENOUS | Status: AC
  Filled 2020-10-25: qty 20

## 2020-10-25 MED ORDER — FENTANYL CITRATE (PF) 250 MCG/5ML IJ SOLN
INTRAMUSCULAR | Status: AC
  Filled 2020-10-25: qty 5

## 2020-10-25 MED ORDER — PROPOFOL 10 MG/ML IV BOLUS
INTRAVENOUS | Status: DC | PRN
  Administered 2020-10-25: 150 mg via INTRAVENOUS
  Administered 2020-10-25: 50 mg via INTRAVENOUS

## 2020-10-25 MED ORDER — DEXAMETHASONE SODIUM PHOSPHATE 10 MG/ML IJ SOLN
INTRAMUSCULAR | Status: AC
  Filled 2020-10-25: qty 1

## 2020-10-25 MED ORDER — ONDANSETRON HCL 4 MG/2ML IJ SOLN
INTRAMUSCULAR | Status: DC | PRN
  Administered 2020-10-25: 4 mg via INTRAVENOUS

## 2020-10-25 MED ORDER — LIDOCAINE 2% (20 MG/ML) 5 ML SYRINGE
INTRAMUSCULAR | Status: AC
  Filled 2020-10-25: qty 5

## 2020-10-25 MED ORDER — FENTANYL CITRATE (PF) 100 MCG/2ML IJ SOLN
25.0000 ug | INTRAMUSCULAR | Status: DC | PRN
  Administered 2020-10-25 (×2): 50 ug via INTRAVENOUS

## 2020-10-25 MED ORDER — THROMBIN 5000 UNITS EX SOLR
OROMUCOSAL | Status: DC | PRN
  Administered 2020-10-25: 5 mL via TOPICAL

## 2020-10-25 MED ORDER — CYCLOBENZAPRINE HCL 10 MG PO TABS: 10.0000 mg | ORAL_TABLET | Freq: Three times a day (TID) | ORAL | 0 refills | Status: DC | PRN

## 2020-10-25 MED ORDER — HEMOSTATIC AGENTS (NO CHARGE) OPTIME
TOPICAL | Status: DC | PRN
  Administered 2020-10-25: 1 via TOPICAL

## 2020-10-25 MED ORDER — THROMBIN 5000 UNITS EX SOLR
CUTANEOUS | Status: AC
  Filled 2020-10-25: qty 5000

## 2020-10-25 MED ORDER — ORAL CARE MOUTH RINSE: 15.0000 mL | Freq: Once | OROMUCOSAL | Status: AC

## 2020-10-25 MED ORDER — BUPIVACAINE HCL (PF) 0.5 % IJ SOLN
INTRAMUSCULAR | Status: AC
  Filled 2020-10-25: qty 30

## 2020-10-25 MED ORDER — DEXAMETHASONE SODIUM PHOSPHATE 10 MG/ML IJ SOLN
INTRAMUSCULAR | Status: DC | PRN
  Administered 2020-10-25: 10 mg via INTRAVENOUS

## 2020-10-25 MED ORDER — FENTANYL CITRATE (PF) 100 MCG/2ML IJ SOLN
INTRAMUSCULAR | Status: AC
  Filled 2020-10-25: qty 2

## 2020-10-25 MED ORDER — LACTATED RINGERS IV SOLN: INTRAVENOUS | Status: DC

## 2020-10-25 MED ORDER — METHYLPREDNISOLONE ACETATE 80 MG/ML IJ SUSP
INTRAMUSCULAR | Status: AC
  Filled 2020-10-25: qty 1

## 2020-10-25 MED ORDER — 0.9 % SODIUM CHLORIDE (POUR BTL) OPTIME
TOPICAL | Status: DC | PRN
  Administered 2020-10-25: 1000 mL

## 2020-10-25 MED ORDER — SUGAMMADEX SODIUM 200 MG/2ML IV SOLN
INTRAVENOUS | Status: DC | PRN
Start: 2020-10-25 — End: 2020-10-25
  Administered 2020-10-25: 200 mg via INTRAVENOUS

## 2020-10-25 MED ORDER — ROCURONIUM BROMIDE 10 MG/ML (PF) SYRINGE
PREFILLED_SYRINGE | INTRAVENOUS | Status: DC | PRN
  Administered 2020-10-25: 60 mg via INTRAVENOUS

## 2020-10-25 MED ORDER — MIDAZOLAM HCL 2 MG/2ML IJ SOLN
INTRAMUSCULAR | Status: AC
  Filled 2020-10-25: qty 2

## 2020-10-25 MED ORDER — OXYCODONE-ACETAMINOPHEN 10-325 MG PO TABS: 1.0000 | ORAL_TABLET | Freq: Four times a day (QID) | ORAL | 0 refills | Status: AC | PRN

## 2020-10-25 MED ORDER — LIDOCAINE-EPINEPHRINE 1 %-1:100000 IJ SOLN
INTRAMUSCULAR | Status: DC | PRN
  Administered 2020-10-25: 5 mL

## 2020-10-25 MED ORDER — ONDANSETRON HCL 4 MG/2ML IJ SOLN
INTRAMUSCULAR | Status: AC
  Filled 2020-10-25: qty 2

## 2020-10-25 MED ORDER — LIDOCAINE 2% (20 MG/ML) 5 ML SYRINGE
INTRAMUSCULAR | Status: DC | PRN
  Administered 2020-10-25: 100 mg via INTRAVENOUS

## 2020-10-25 SURGICAL SUPPLY — 62 items
ADH SKN CLS APL DERMABOND .7 (GAUZE/BANDAGES/DRESSINGS) ×1
APL SKNCLS STERI-STRIP NONHPOA (GAUZE/BANDAGES/DRESSINGS)
BAG COUNTER SPONGE SURGICOUNT (BAG) ×2 IMPLANT
BAG SPNG CNTER NS LX DISP (BAG) ×1
BAND INSRT 18 STRL LF DISP RB (MISCELLANEOUS) ×2
BAND RUBBER #18 3X1/16 STRL (MISCELLANEOUS) ×4 IMPLANT
BENZOIN TINCTURE PRP APPL 2/3 (GAUZE/BANDAGES/DRESSINGS) IMPLANT
BLADE CLIPPER SURG (BLADE) IMPLANT
BLADE SURG 11 STRL SS (BLADE) ×2 IMPLANT
BUR MATCHSTICK NEURO 3.0 LAGG (BURR) ×1 IMPLANT
BUR PRECISION FLUTE 5.0 (BURR) ×1 IMPLANT
CANISTER SUCT 3000ML PPV (MISCELLANEOUS) ×2 IMPLANT
CARTRIDGE OIL MAESTRO DRILL (MISCELLANEOUS) ×1 IMPLANT
DECANTER SPIKE VIAL GLASS SM (MISCELLANEOUS) ×2 IMPLANT
DERMABOND ADVANCED (GAUZE/BANDAGES/DRESSINGS) ×1
DERMABOND ADVANCED .7 DNX12 (GAUZE/BANDAGES/DRESSINGS) ×1 IMPLANT
DIFFUSER DRILL AIR PNEUMATIC (MISCELLANEOUS) ×2 IMPLANT
DRAPE LAPAROTOMY 100X72X124 (DRAPES) ×2 IMPLANT
DRAPE MICROSCOPE LEICA (MISCELLANEOUS) ×2 IMPLANT
DRAPE SURG 17X23 STRL (DRAPES) ×2 IMPLANT
DRSG OPSITE POSTOP 3X4 (GAUZE/BANDAGES/DRESSINGS) ×2 IMPLANT
DRSG OPSITE POSTOP 4X6 (GAUZE/BANDAGES/DRESSINGS) ×1 IMPLANT
DURAPREP 26ML APPLICATOR (WOUND CARE) ×2 IMPLANT
ELECT REM PT RETURN 9FT ADLT (ELECTROSURGICAL) ×2
ELECTRODE REM PT RTRN 9FT ADLT (ELECTROSURGICAL) ×1 IMPLANT
GAUZE 4X4 16PLY ~~LOC~~+RFID DBL (SPONGE) ×1 IMPLANT
GAUZE SPONGE 4X4 12PLY STRL (GAUZE/BANDAGES/DRESSINGS) IMPLANT
GAUZE SPONGE 4X4 16PLY XRAY LF (GAUZE/BANDAGES/DRESSINGS) ×1 IMPLANT
GLOVE EXAM NITRILE XL STR (GLOVE) ×1 IMPLANT
GLOVE SURG ENC MOIS LTX SZ7.5 (GLOVE) ×1 IMPLANT
GLOVE SURG LTX SZ7 (GLOVE) ×4 IMPLANT
GLOVE SURG UNDER POLY LF SZ7.5 (GLOVE) ×4 IMPLANT
GOWN STRL REUS W/ TWL LRG LVL3 (GOWN DISPOSABLE) ×2 IMPLANT
GOWN STRL REUS W/ TWL XL LVL3 (GOWN DISPOSABLE) IMPLANT
GOWN STRL REUS W/TWL 2XL LVL3 (GOWN DISPOSABLE) IMPLANT
GOWN STRL REUS W/TWL LRG LVL3 (GOWN DISPOSABLE) ×4
GOWN STRL REUS W/TWL XL LVL3 (GOWN DISPOSABLE) ×2
GRAFT DURAGEN MATRIX 1WX1L (Tissue) ×1 IMPLANT
HEMOSTAT POWDER KIT SURGIFOAM (HEMOSTASIS) ×2 IMPLANT
KIT BASIN OR (CUSTOM PROCEDURE TRAY) ×2 IMPLANT
KIT TURNOVER KIT B (KITS) ×2 IMPLANT
NDL HYPO 18GX1.5 BLUNT FILL (NEEDLE) IMPLANT
NDL SPNL 18GX3.5 QUINCKE PK (NEEDLE) IMPLANT
NEEDLE HYPO 18GX1.5 BLUNT FILL (NEEDLE) ×2 IMPLANT
NEEDLE HYPO 22GX1.5 SAFETY (NEEDLE) ×2 IMPLANT
NEEDLE SPNL 18GX3.5 QUINCKE PK (NEEDLE) ×2 IMPLANT
NS IRRIG 1000ML POUR BTL (IV SOLUTION) ×2 IMPLANT
OIL CARTRIDGE MAESTRO DRILL (MISCELLANEOUS) ×2
PACK LAMINECTOMY NEURO (CUSTOM PROCEDURE TRAY) ×2 IMPLANT
PAD ARMBOARD 7.5X6 YLW CONV (MISCELLANEOUS) ×6 IMPLANT
SEALANT ADHERUS EXTEND TIP (MISCELLANEOUS) ×1 IMPLANT
SPONGE SURGIFOAM ABS GEL SZ50 (HEMOSTASIS) ×1 IMPLANT
SPONGE T-LAP 4X18 ~~LOC~~+RFID (SPONGE) ×1 IMPLANT
STRIP CLOSURE SKIN 1/2X4 (GAUZE/BANDAGES/DRESSINGS) IMPLANT
SUT VIC AB 0 CT1 18XCR BRD8 (SUTURE) ×1 IMPLANT
SUT VIC AB 0 CT1 8-18 (SUTURE) ×2
SUT VIC AB 2-0 CT1 18 (SUTURE) IMPLANT
SUT VICRYL 3-0 RB1 18 ABS (SUTURE) ×4 IMPLANT
SYR 3ML LL SCALE MARK (SYRINGE) IMPLANT
TOWEL GREEN STERILE (TOWEL DISPOSABLE) ×2 IMPLANT
TOWEL GREEN STERILE FF (TOWEL DISPOSABLE) ×2 IMPLANT
WATER STERILE IRR 1000ML POUR (IV SOLUTION) ×2 IMPLANT

## 2020-10-25 NOTE — Anesthesia Procedure Notes (Signed)
Procedure Name: Intubation Date/Time: 10/25/2020 3:02 PM Performed by: Jenne Campus, CRNA Pre-anesthesia Checklist: Patient identified, Emergency Drugs available, Suction available and Patient being monitored Patient Re-evaluated:Patient Re-evaluated prior to induction Oxygen Delivery Method: Circle System Utilized Preoxygenation: Pre-oxygenation with 100% oxygen Induction Type: IV induction Ventilation: Mask ventilation without difficulty Laryngoscope Size: Miller and 3 Grade View: Grade II Tube type: Oral Tube size: 7.5 mm Number of attempts: 1 Airway Equipment and Method: Stylet and Oral airway Placement Confirmation: ETT inserted through vocal cords under direct vision, positive ETCO2 and breath sounds checked- equal and bilateral Secured at: 21 cm Tube secured with: Tape Dental Injury: Teeth and Oropharynx as per pre-operative assessment

## 2020-10-25 NOTE — Transfer of Care (Signed)
Immediate Anesthesia Transfer of Care Note  Patient: [name] [name]  Procedure(s) Performed: MICRODISCECTOMY LUMBAR FIVE-SACRAL ONE (Right: Back)  Patient Location: PACU  Anesthesia Type:General  Level of Consciousness: oriented, drowsy and patient cooperative  Airway & Oxygen Therapy: Patient Spontanous Breathing and Patient connected to face mask oxygen  Post-op Assessment: Report given to RN and Post -op Vital signs reviewed and stable  Post vital signs: Reviewed  Last Vitals:  Vitals Value Taken Time  BP 159/81 10/25/20 1632  Temp    Pulse 75 10/25/20 1634  Resp 18 10/25/20 1634  SpO2 97 % 10/25/20 1634  Vitals shown include unvalidated device data.  Last Pain:  Vitals:   10/25/20 1012  TempSrc:   PainSc: 3       Patients Stated Pain Goal: 3 (22/02/54 2706)  Complications: No notable events documented.

## 2020-10-25 NOTE — Anesthesia Postprocedure Evaluation (Signed)
Anesthesia Post Note  Patient: [name] [name]  Procedure(s) Performed: MICRODISCECTOMY LUMBAR FIVE-SACRAL ONE (Right: Back)     Patient location during evaluation: PACU Anesthesia Type: General Level of consciousness: awake Pain management: pain level controlled Vital Signs Assessment: post-procedure vital signs reviewed and stable Respiratory status: spontaneous breathing Cardiovascular status: stable Anesthetic complications: no   No notable events documented.  Last Vitals:  Vitals:   10/25/20 1632 10/25/20 1645  BP: (!) 159/81 (!) 148/77  Pulse:  72  Resp: 17 13  Temp: (!) 36.2 C   SpO2: 99% 98%    Last Pain:  Vitals:   10/25/20 1645  TempSrc:   PainSc: 7                  Pat Sires

## 2020-10-25 NOTE — Discharge Summary (Signed)
Physician Discharge Summary  Patient ID: [name] [name] MRN: [id number] DOB/AGE: Feb 04, 1950 70 y.o.  Admit date: 10/25/2020 Discharge date: 10/25/2020  Admission Diagnoses: Lumbar spondyosis with radiculopathy, L5-S1 right  Discharge Diagnoses: Same Active Problems:   * No active hospital problems. *   Discharged Condition: Stable  Hospital Course:  [name] [name] is a 70 y.o. male who underwent uncomplicated right Y0-P4 laminotomy/foraminotomy. Back pain was controlled with oral medication, he was ambulating without difficulty, voiding normally, and tolerating diet. Pain in his right leg was significant improved.  Treatments: Surgery - right L5-S1 laminotomy, foraminotomy  Discharge Exam: Blood pressure (!) 146/87, pulse 69, temperature (!) 97 F (36.1 C), resp. rate 17, height 5\' 8"  (1.727 m), weight 111.1 kg, SpO2 97 %. Awake, alert, oriented Speech fluent, appropriate CN grossly intact 5/5 BUE/BLE Wound c/d/i   Disposition: Discharge disposition: 01-Home or Self Care       Discharge Instructions     Call MD for:  redness, tenderness, or signs of infection (pain, swelling, redness, odor or green/yellow discharge around incision site)   Complete by: As directed    Call MD for:  temperature >100.4   Complete by: As directed    Diet - low sodium heart healthy   Complete by: As directed    Discharge instructions   Complete by: As directed    Walk at home as much as possible, at least 4 times / day   Increase activity slowly   Complete by: As directed    Lifting restrictions   Complete by: As directed    No lifting > 10 lbs   May shower / Bathe   Complete by: As directed    48 hours after surgery   May walk up steps   Complete by: As directed    Other Restrictions   Complete by: As directed    No bending/twisting at waist   Remove dressing in 48 hours   Complete by: As directed       Allergies as of 10/25/2020       Reactions   Crestor  [rosuvastatin] Other (See Comments)   Other reaction(s): Unknown   Pravastatin Sodium Other (See Comments)   Other reaction(s): Unknown   Sertraline Hcl Other (See Comments)   Other reaction(s): Unknown   Simvastatin Other (See Comments)   Other reaction(s): Unknown        Medication List     TAKE these medications    cholecalciferol 25 MCG (1000 UNIT) tablet Commonly known as: VITAMIN D Take 1,000 mcg by mouth daily.   cyclobenzaprine 10 MG tablet Commonly known as: FLEXERIL Take 1 tablet (10 mg total) by mouth 3 (three) times daily as needed for muscle spasms.   lovastatin 40 MG tablet Commonly known as: MEVACOR Take 40 mg by mouth daily.   Magnesium 250 MG Tabs Take 250 mg by mouth daily.   omeprazole 20 MG capsule Commonly known as: PRILOSEC Take 20 mg by mouth daily.   oxyCODONE-acetaminophen 10-325 MG tablet Commonly known as: Percocet Take 1 tablet by mouth every 6 (six) hours as needed for up to 7 days for pain.   pregabalin 100 MG capsule Commonly known as: LYRICA Take 100 mg by mouth 3 (three) times daily.   rivaroxaban 20 MG Tabs tablet Commonly known as: XARELTO Take 1 tablet (20 mg total) by mouth daily with supper. Start taking on: October 30, 2020 What changed: These instructions start on October 30, 2020. If you are unsure  what to do until then, ask your doctor or other care provider.        Follow-up Information     Consuella Lose, MD Follow up in 3 week(s).   Specialty: Neurosurgery Contact information: [address] [place] [address] [phone]                 Signed: Jairo Ben 10/25/2020, 6:42 PM

## 2020-10-25 NOTE — Progress Notes (Signed)
Pt in phase II waiting for surgeon to put in discharge instructions. Vital signs stable. No further monitoring required.   Gabriel Rainwater, RN

## 2020-10-25 NOTE — H&P (Signed)
Chief Complaint  Back and right leg pain  History of Present Illness  [name] [name] is a 70 y.o. male seen in the outpatient clinic for evaluation of relatively chronic right-sided back and leg pain.  Symptoms started about four months ago when he was walking up the stairs, nearly tripped and twisted his back.  Since then he has been having severe right-sided back and leg pain described as a radiating pain from the right flank region through the right buttock, posterior thigh, calf, and into the top of his foot.  He also describes the last three or four toes and his right foot "curling up."  He does not report any left leg symptoms.  No changes in bladder function.  He was put on some pain medicine a few weeks ago after a visit to the Urgent Care with minimal relief.  He has actually undergone what sounds like an outpatient epidural steroid injection done at the outpatient [place] on [facility].  He says this has provided some mild improvement in his symptoms, but he still gets relatively severe pain after he walks or even a few minutes.  Of note, the patient does not report any history of hypertension or diabetes.  He reports a history of atrial fibrillation and was on Xarelto which has been stopped for the last several days.  Past Medical History   Past Medical History:  Diagnosis Date   Arthritis    Atrial fibrillation ([place])    Cancer ([place]) 2015   melanoma removed from back   GERD (gastroesophageal reflux disease)    History of cardiac dysrhythmia 2018   ablation and cardioversion done   Hyperlipidemia    Nonischemic cardiomyopathy ([place])    Scab    right elbow healing   Umbilical hernia     Past Surgical History   Past Surgical History:  Procedure Laterality Date   ATRIAL FIBRILLATION ABLATION N/A 09/07/2016   Procedure: Atrial Fibrillation Ablation;  Surgeon: Constance Haw, MD;  Location: [facility];  Service: Cardiovascular;  Laterality: N/A;    CARDIOVERSION N/A 07/23/2016   Procedure: CARDIOVERSION;  Surgeon: Sanda Klein, MD;  Location: [place] [facility] ENDOSCOPY;  Service: Cardiovascular;  Laterality: N/A;   CARDIOVERSION N/A 10/03/2016   Procedure: CARDIOVERSION;  Surgeon: Josue Hector, MD;  Location[facility] ENDOSCOPY;  Service: Cardiovascular;  Laterality: N/A;   HERNIA REPAIR  at birth   [place] Bilateral 10/22/2019   Procedure: LAPAROSCOPIC BILATERAL INGUINAL HERNIA REPAIR WITH MESH;  Surgeon: Kinsinger, Arta Bruce, MD;  Location: [facility];  Service: General;  Laterality: Bilateral;   UMBILICAL HERNIA REPAIR N/A 10/22/2019   Procedure: HERNIA REPAIR UMBILICAL ADULT;  Surgeon: Kinsinger, Arta Bruce, MD;  Location: [facility];  Service: General;  Laterality: N/A;    Social History   Social History   Tobacco Use   Smoking status: Never   Smokeless tobacco: Never  Vaping Use   Vaping Use: Never used  Substance Use Topics   Alcohol use: Not Currently    Comment: 1-2 beers per day   Drug use: No    Medications   Prior to Admission medications   Medication Sig Start Date End Date Taking? Authorizing Provider  cholecalciferol (VITAMIN D) 25 MCG (1000 UNIT) tablet Take 1,000 mcg by mouth daily.   Yes [provider]  lovastatin (MEVACOR) 40 MG tablet Take 40 mg by mouth daily.   Yes [provider]  omeprazole (PRILOSEC) 20 MG capsule  Take 20 mg by mouth daily.   Yes [provider]  Magnesium 250 MG TABS Take 250 mg by mouth daily.    [provider]  pregabalin (LYRICA) 100 MG capsule Take 100 mg by mouth 3 (three) times daily. 10/17/20   [provider]  rivaroxaban (XARELTO) 20 MG TABS tablet Take 1 tablet (20 mg total) by mouth daily with supper. 11/04/19   Croitoru, Mihai, MD    Allergies   Allergies  Allergen Reactions   Crestor [Rosuvastatin] Other (See Comments)    Other reaction(s): Unknown   Pravastatin Sodium Other (See  Comments)    Other reaction(s): Unknown   Sertraline Hcl Other (See Comments)    Other reaction(s): Unknown   Simvastatin Other (See Comments)    Other reaction(s): Unknown    Review of Systems  ROS  Neurologic Exam  Awake, alert, oriented Memory and concentration grossly intact Speech fluent, appropriate CN grossly intact Motor exam: Upper Extremities Deltoid Bicep Tricep Grip  Right 5/5 5/5 5/5 5/5  Left 5/5 5/5 5/5 5/5   Lower Extremities IP Quad PF DF EHL  Right 5/5 5/5 5/5 5/5 5/5  Left 5/5 5/5 5/5 5/5 5/5   Sensation grossly intact to LT  Imaging  MRI of the lumbar spine dated 08/16/2020 was personally reviewed.  This demonstrates maintenance of lumbar lordosis.  There is significant disc desiccation and loss of height at L5-S1.  There is guided focal facet osteophyte as well as a right eccentric disc bulge which results in relatively focal right-sided lateral recess stenosis which is quite severe.  Also seen is moderate to severe central stenosis at L4-5 due primarily to facet hypertrophy and ligamentous hypertrophy.  Impression  - 70 y.o. male with right back and leg pain related to right eccentric disc at L5-S1  Plan  - Will proceed with right L5-S1 laminotomy/microdiscectomy  I have reviewed the details of the surgery as well as the expected postop course and recovery with the patient and his wife in the office. We discussed the associated risks, benefits, and alternatives to surgery. All his questions today were answered and he provided informed consent to proceed.  Consuella Lose, MD [facility]

## 2020-10-26 ENCOUNTER — Encounter (HOSPITAL_COMMUNITY): Payer: Self-pay | Admitting: Neurosurgery

## 2020-10-26 NOTE — Op Note (Signed)
NEUROSURGERY OPERATIVE NOTE   PREOP DIAGNOSIS: Lumbar radiculopathy, S1  POSTOP DIAGNOSIS: Same  PROCEDURE: 1. Right L5-S1 laminotomy with partial medial facetectomyfor decompression of nerve root 2. Use of operating microscope  SURGEON: Dr. Consuella Lose, MD  ASSISTANT: None  ANESTHESIA: General Endotracheal  EBL: 100cc  SPECIMENS: None  DRAINS: None  COMPLICATIONS: None immediate  CONDITION: Hemodynamically stable to PACU  HISTORY: [name] [name] is a 70 y.o. male initially seen in the outpatient neurosurgery clinic complaining primarily of right-sided back and leg pain.  His work-up included MRI scan demonstrating severe lateral recess stenosis at L5-S1 on the right.  Patient therefore elected to proceed with surgical decompression.  The risks, benefits, and alternatives to surgery were all reviewed in detail with the patient.  After all his questions were answered informed consent was obtained and witnessed.  PROCEDURE IN DETAIL: After informed consent was obtained and witnessed, the patient was brought to the operating room. After induction of general anesthesia, the patient was positioned on the operative table in the prone position with all pressure points meticulously padded. The skin of the low back was then prepped and draped in the usual sterile fashion.  Under x-ray, the correct level was identified and marked out on the skin, and after timeout was conducted, the skin was infiltrated with local anesthetic. Skin incision was then made sharply and Bovie electrocautery was used to dissect the subcutaneous tissue until the lumbodorsal fascia was identified. The fashion was then incised using Bovie electrocautery and the lamina at the L4 level initially was dissected.  Dissector was placed in the interspace and location was identified to be at L4-5.  Dissection was therefore carried more inferiorly to identify the L5 lamina.  Another dissector was placed and location was  confirmed to be at the L5-S1 interspace.  Self-retaining retractor was then placed.  Using a high-speed drill, laminotomy was completed with a partial medial facetectomy. The ligamentum flavum was then identified and removed and the lateral edge of the thecal sac was identified.  I was then able to identify the distal portion of the right S1 nerve root.  There did appear to be significant hypertrophy of the ligamentum flavum underneath the L5-S1 facet complex.  There also appeared to be relatively large bone spurs emanating from the facet and significantly compressing the right S1 nerve root at its shoulder.  During dissection in the ventral epidural space I did note some egress of CSF.  I was able to dissect the ligamentum flavum and the facet complex away from the nerve and remove the medial facet complex with Kerrison punches and the high-speed drill.  This then completely decompressed the S1 root in the lateral recess.  I did not appreciate any significant compression ventrally from the disc space and elected not to enter the disc space itself.  Hemostasis was then easily secured with a combination of bipolar electrocautery and morselized Gelfoam with thrombin.  At this point no further CSF egress was noted.  I did cover the dura and nerve root with a layer of polyethylene glycol dural sealant.  Self-retaining retractor was then removed, and the wound is closed in layers using a combination of interrupted 0 Vicryl and 3-0 Vicryl stitches. The skin was closed using standard skin glue.  At the end of the case all sponge, needle, and instrument counts were correct. The patient was then transferred to the stretcher and taken to the postanesthesia care unit in stable hemodynamic condition.   Consuella Lose, MD  [facility]

## 2021-01-11 ENCOUNTER — Ambulatory Visit
Admission: EM | Admit: 2021-01-11 | Discharge: 2021-01-11 | Disposition: A | Discharge status: Home / Self Care | Payer: Medicare Other | Attending: Physician Assistant | Admitting: Physician Assistant

## 2021-01-11 ENCOUNTER — Other Ambulatory Visit: Payer: Self-pay

## 2021-01-11 ENCOUNTER — Encounter: Payer: Self-pay | Admitting: Emergency Medicine

## 2021-01-11 DIAGNOSIS — J329 Chronic sinusitis, unspecified: Secondary | ICD-10-CM | POA: Diagnosis not present

## 2021-01-11 DIAGNOSIS — J40 Bronchitis, not specified as acute or chronic: Secondary | ICD-10-CM | POA: Diagnosis not present

## 2021-01-11 MED ORDER — AZITHROMYCIN 250 MG PO TABS: 250.0000 mg | ORAL_TABLET | Freq: Every day | ORAL | 0 refills | Status: DC

## 2021-01-11 MED ORDER — PREDNISONE 20 MG PO TABS: 40.0000 mg | ORAL_TABLET | Freq: Every day | ORAL | 0 refills | Status: AC

## 2021-01-11 NOTE — ED Triage Notes (Signed)
Cough for 8 days, reports green mucus.

## 2021-01-11 NOTE — ED Provider Notes (Signed)
[name]    CSN: [id number] Arrival date & time: 01/11/21  3382      History   Chief Complaint Chief Complaint  Patient presents with   Cough    HPI [name] [name] is a 70 y.o. male.   Patient here today for evaluation of cough has had for the last 8 days.  He reports that cough is productive of green mucus at times.  He has not had known fever.  He has had some congestion.  He denies any body aches, nausea, vomiting or diarrhea.  He does report some sore throat.  He has tried over-the-counter medication without significant relief.  The history is provided by the patient.  Cough Associated symptoms: sore throat   Associated symptoms: no chills, no ear pain, no eye discharge, no fever and no shortness of breath    Past Medical History:  Diagnosis Date   Arthritis    Atrial fibrillation ([place])    Cancer ([place]) 2015   melanoma removed from back   GERD (gastroesophageal reflux disease)    History of cardiac dysrhythmia 2018   ablation and cardioversion done   Hyperlipidemia    Nonischemic cardiomyopathy ([place])    Scab    right elbow healing   Umbilical hernia     Patient Active Problem List   Diagnosis Date Noted   Paroxysmal atrial fibrillation ([place]) 09/07/2016   Family history of coronary artery disease in brother 07/03/2016   Nonischemic cardiomyopathy ([place]) 06/08/2016   Hyperglycemia 06/08/2016   Atrial fibrillation ([place]) 06/08/2016   Hypercholesterolemia 06/08/2016   Moderate obesity 06/08/2016    Past Surgical History:  Procedure Laterality Date   ATRIAL FIBRILLATION ABLATION N/A 09/07/2016   Procedure: Atrial Fibrillation Ablation;  Surgeon: Constance Haw, MD;  Location: [facility];  Service: Cardiovascular;  Laterality: N/A;   CARDIOVERSION N/A 07/23/2016   Procedure: CARDIOVERSION;  Surgeon: Sanda Klein, MD;  Location: [facility] ENDOSCOPY;  Service: Cardiovascular;  Laterality: N/A;   CARDIOVERSION N/A 10/03/2016   Procedure:  CARDIOVERSION;  Surgeon: Josue Hector, MD;  Location: [facility] ENDOSCOPY;  Service: Cardiovascular;  Laterality: N/A;   HERNIA REPAIR  at birth   [place] Bilateral 10/22/2019   Procedure: LAPAROSCOPIC BILATERAL INGUINAL HERNIA REPAIR WITH MESH;  Surgeon: Kinsinger, Arta Bruce, MD;  Location: [facility];  Service: General;  Laterality: Bilateral;   LUMBAR LAMINECTOMY/DECOMPRESSION MICRODISCECTOMY Right 10/25/2020   Procedure: MICRODISCECTOMY LUMBAR FIVE-SACRAL ONE;  Surgeon: Consuella Lose, MD;  Location: [facility];  Service: Neurosurgery;  Laterality: Right;   UMBILICAL HERNIA REPAIR N/A 10/22/2019   Procedure: HERNIA REPAIR UMBILICAL ADULT;  Surgeon: Kinsinger, Arta Bruce, MD;  Location: [facility];  Service: General;  Laterality: N/A;       Home Medications    Prior to Admission medications   Medication Sig Start Date End Date Taking? Authorizing Provider  azithromycin (ZITHROMAX) 250 MG tablet Take 1 tablet (250 mg total) by mouth daily. Take first 2 tablets together, then 1 every day until finished. 01/11/21  Yes Francene Finders, PA-C  predniSONE (DELTASONE) 20 MG tablet Take 2 tablets (40 mg total) by mouth daily with breakfast for 5 days. 01/11/21 01/16/21 Yes Francene Finders, PA-C  cholecalciferol (VITAMIN D) 25 MCG (1000 UNIT) tablet Take 1,000 mcg by mouth daily.    [provider]  cyclobenzaprine (FLEXERIL) 10 MG tablet Take 1 tablet (10 mg total) by mouth 3 (three) times daily as needed for muscle spasms. 10/25/20  Consuella Lose, MD  lovastatin (MEVACOR) 40 MG tablet Take 40 mg by mouth daily.    [provider]  Magnesium 250 MG TABS Take 250 mg by mouth daily.    [provider]  omeprazole (PRILOSEC) 20 MG capsule Take 20 mg by mouth daily.    [provider]  pregabalin (LYRICA) 100 MG capsule Take 100 mg by mouth 3 (three) times daily. 10/17/20   [provider]  rivaroxaban (XARELTO)  20 MG TABS tablet Take 1 tablet (20 mg total) by mouth daily with supper. 10/30/20   Consuella Lose, MD    Family History Family History  Problem Relation Age of Onset   Heart Problems Father    Alzheimer's disease Sister    Stroke Brother    Heart Problems Brother    Heart Problems Brother     Social History Social History   Tobacco Use   Smoking status: Never   Smokeless tobacco: Never  Vaping Use   Vaping Use: Never used  Substance Use Topics   Alcohol use: Not Currently    Comment: 1-2 beers per day   Drug use: No     Allergies   Crestor [rosuvastatin], Pravastatin sodium, Sertraline hcl, and Simvastatin   Review of Systems Review of Systems  Constitutional:  Negative for chills and fever.  HENT:  Positive for congestion and sore throat. Negative for ear pain.   Eyes:  Negative for discharge and redness.  Respiratory:  Positive for cough. Negative for shortness of breath.   Gastrointestinal:  Negative for abdominal pain, diarrhea, nausea and vomiting.    Physical Exam Triage Vital Signs ED Triage Vitals  Enc Vitals Group     BP      Pulse      Resp      Temp      Temp src      SpO2      Weight      Height      Head Circumference      Peak Flow      Pain Score      Pain Loc      Pain Edu?      Excl. in [place]?    No data found.  Updated Vital Signs BP (!) 157/103 (BP Location: Left Arm)    Pulse 74    Temp 98.1 F (36.7 C) (Oral)    Resp 16    SpO2 94%      Physical Exam Vitals and nursing note reviewed.  Constitutional:      General: He is not in acute distress.    Appearance: He is well-developed. He is not ill-appearing.  HENT:     Head: Normocephalic and atraumatic.     Right Ear: Tympanic membrane normal.     Left Ear: Tympanic membrane normal.     Nose: Congestion (mild) present.     Mouth/Throat:     Mouth: Mucous membranes are moist.     Pharynx: No oropharyngeal exudate or posterior oropharyngeal erythema.     Tonsils: 0 on  the right. 0 on the left.  Eyes:     Conjunctiva/sclera: Conjunctivae normal.  Cardiovascular:     Rate and Rhythm: Normal rate and regular rhythm.     Heart sounds: Normal heart sounds. No murmur heard. Pulmonary:     Effort: Pulmonary effort is normal. No respiratory distress.     Breath sounds: Normal breath sounds. No wheezing, rhonchi or rales.  Skin:  General: Skin is warm and dry.  Neurological:     Mental Status: He is alert.  Psychiatric:        Mood and Affect: Mood normal.        Behavior: Behavior normal.     UC Treatments / Results  Labs (all labs ordered are listed, but only abnormal results are displayed) Labs Reviewed - No data to display  EKG   Radiology No results found.  Procedures Procedures (including critical care time)  Medications Ordered in UC Medications - No data to display  Initial Impression / Assessment and Plan / UC Course  I have reviewed the triage vital signs and the nursing notes.  Pertinent labs & imaging results that were available during my care of the patient were reviewed by me and considered in my medical decision making (see chart for details).    Z-Pak and prednisone prescribed to cover for sinobronchitis as well as atypical pneumonia.  Recommended follow-up if no improvement with treatment or if symptoms worsen in any way.  Final Clinical Impressions(s) / UC Diagnoses   Final diagnoses:  Sinobronchitis   Discharge Instructions   None    ED Prescriptions     Medication Sig Dispense Auth. Provider   azithromycin (ZITHROMAX) 250 MG tablet Take 1 tablet (250 mg total) by mouth daily. Take first 2 tablets together, then 1 every day until finished. 6 tablet Ewell Poe F, PA-C   predniSONE (DELTASONE) 20 MG tablet Take 2 tablets (40 mg total) by mouth daily with breakfast for 5 days. 10 tablet Francene Finders, PA-C      PDMP not reviewed this encounter.   Francene Finders, PA-C 01/11/21 [phone]

## 2021-01-21 ENCOUNTER — Other Ambulatory Visit: Payer: Self-pay | Admitting: Cardiovascular Disease

## 2021-01-21 DIAGNOSIS — I4819 Other persistent atrial fibrillation: Secondary | ICD-10-CM

## 2021-01-23 ENCOUNTER — Telehealth: Payer: Self-pay | Admitting: Cardiovascular Disease

## 2021-01-23 DIAGNOSIS — I4819 Other persistent atrial fibrillation: Secondary | ICD-10-CM

## 2021-01-23 NOTE — Telephone Encounter (Signed)
°*  STAT* If patient is at the pharmacy, call can be transferred to refill team.   1. Which medications need to be refilled? (please list name of each medication and dose if known) XARELTO 20 MG TABS tablet  2. Which pharmacy/location (including street and city if local pharmacy) is medication to be sent to? [facility] [place]  3. Do they need a 30 day or 90 day supply? [place]

## 2021-01-23 NOTE — Telephone Encounter (Signed)
Prescription refill request for Xarelto received.  Indication:Afib Last office visit:1/22 Weight:111.1 kg Age:72 Scr:0.9 CrCl:123.44 ml/min  Prescription refilled

## 2021-01-24 MED ORDER — RIVAROXABAN 20 MG PO TABS: ORAL_TABLET | ORAL | 0 refills | Status: DC

## 2021-01-24 NOTE — Telephone Encounter (Addendum)
Prescription refill request for Xarelto received.  Indication: Afib  Last office visit: Croitoru, 01/27/2020 Weight:111.1 kg  Age: 71 yo  Scr: 0.94, 10/25/2020 CrCl: 118 ml/min   Refill sent. Pt due to see cardiologist. Message sent to schedulers.

## 2021-01-27 ENCOUNTER — Telehealth: Payer: Self-pay | Admitting: *Deleted

## 2021-01-27 NOTE — Telephone Encounter (Signed)
Prior auth sent to [facility] for Xarelto.   Sent to be scanned as well

## 2021-01-30 DIAGNOSIS — R030 Elevated blood-pressure reading, without diagnosis of hypertension: Secondary | ICD-10-CM | POA: Diagnosis not present

## 2021-01-30 DIAGNOSIS — M5126 Other intervertebral disc displacement, lumbar region: Secondary | ICD-10-CM | POA: Diagnosis not present

## 2021-01-30 DIAGNOSIS — Z6838 Body mass index (BMI) 38.0-38.9, adult: Secondary | ICD-10-CM | POA: Diagnosis not present

## 2021-01-30 NOTE — Telephone Encounter (Signed)
Xarelto PA approved through 01/27/2022

## 2021-02-13 DIAGNOSIS — G629 Polyneuropathy, unspecified: Secondary | ICD-10-CM | POA: Diagnosis not present

## 2021-02-13 DIAGNOSIS — Z9889 Other specified postprocedural states: Secondary | ICD-10-CM | POA: Diagnosis not present

## 2021-02-13 DIAGNOSIS — M5430 Sciatica, unspecified side: Secondary | ICD-10-CM | POA: Diagnosis not present

## 2021-03-22 ENCOUNTER — Other Ambulatory Visit: Payer: Self-pay

## 2021-03-22 ENCOUNTER — Ambulatory Visit (INDEPENDENT_AMBULATORY_CARE_PROVIDER_SITE_OTHER): Payer: Medicare Other | Admitting: Cardiovascular Disease

## 2021-03-22 ENCOUNTER — Encounter: Payer: Self-pay | Admitting: Cardiovascular Disease

## 2021-03-22 VITALS — BP 122/78 | HR 67 | Ht 68.0 in | Wt 252.0 lb

## 2021-03-22 DIAGNOSIS — D6869 Other thrombophilia: Secondary | ICD-10-CM | POA: Diagnosis not present

## 2021-03-22 DIAGNOSIS — R931 Abnormal findings on diagnostic imaging of heart and coronary circulation: Secondary | ICD-10-CM | POA: Diagnosis not present

## 2021-03-22 DIAGNOSIS — Z6838 Body mass index (BMI) 38.0-38.9, adult: Secondary | ICD-10-CM

## 2021-03-22 DIAGNOSIS — I5032 Chronic diastolic (congestive) heart failure: Secondary | ICD-10-CM

## 2021-03-22 DIAGNOSIS — I480 Paroxysmal atrial fibrillation: Secondary | ICD-10-CM

## 2021-03-22 DIAGNOSIS — E7800 Pure hypercholesterolemia, unspecified: Secondary | ICD-10-CM

## 2021-03-22 DIAGNOSIS — I77810 Thoracic aortic ectasia: Secondary | ICD-10-CM | POA: Diagnosis not present

## 2021-03-22 MED ORDER — EZETIMIBE 10 MG PO TABS: 10.0000 mg | ORAL_TABLET | Freq: Every day | ORAL | 3 refills | Status: DC

## 2021-03-22 NOTE — Patient Instructions (Signed)
Medication Instructions:  ?START Zetia 10 mg once daily ? ?*If you need a refill on your cardiac medications before your next appointment, please call your pharmacy* ? ? ?Lab Work: ?None ordered ?If you have labs (blood work) drawn today and your tests are completely normal, you will receive your results only by: ?MyChart Message (if you have MyChart) OR ?A paper copy in the mail ?If you have any lab test that is abnormal or we need to change your treatment, we will call you to review the results. ? ? ?Testing/Procedures: ?None ordered ? ? ?Follow-Up: ?At [facility], you and your health needs are our priority.  As part of our continuing mission to provide you with exceptional heart care, we have created designated Provider Care Teams.  These Care Teams include your primary Cardiologist (physician) and Advanced Practice Providers (APPs -  Physician Assistants and Nurse Practitioners) who all work together to provide you with the care you need, when you need it. ? ?We recommend signing up for the patient portal called "MyChart".  Sign up information is provided on this After Visit Summary.  MyChart is used to connect with patients for Virtual Visits (Telemedicine).  Patients are able to view lab/test results, encounter notes, upcoming appointments, etc.  Non-urgent messages can be sent to your provider as well.   ?To learn more about what you can do with MyChart, go to [email or web address].   ? ?Your next appointment:   ?12 month(s) ? ?The format for your next appointment:   ?In Person ? ?Provider:   ?Sanda Klein, MD { ? ?Other Instructions ?Low-Sodium Eating Plan ?Sodium, which is an element that makes up salt, helps you maintain a healthy balance of fluids in your body. Too much sodium can increase your blood pressure and cause fluid and waste to be held in your body. ?Your health care provider or dietitian may recommend following this plan if you have high blood pressure (hypertension), kidney disease,  liver disease, or heart failure. Eating less sodium can help lower your blood pressure, reduce swelling, and protect your heart, liver, and kidneys. ?What are tips for following this plan? ?Reading food labels ?The Nutrition Facts label lists the amount of sodium in one serving of the food. If you eat more than one serving, you must multiply the listed amount of sodium by the number of servings. ?Choose foods with less than 140 mg of sodium per serving. ?Avoid foods with 300 mg of sodium or more per serving. ?Shopping ? ?Look for lower-sodium products, often labeled as "low-sodium" or "no salt added." ?Always check the sodium content, even if foods are labeled as "unsalted" or "no salt added." ?Buy fresh foods. ?Avoid canned foods and pre-made or frozen meals. ?Avoid canned, cured, or processed meats. ?Buy breads that have less than 80 mg of sodium per slice. ?Cooking ? ?Eat more home-cooked food and less restaurant, buffet, and fast food. ?Avoid adding salt when cooking. Use salt-free seasonings or herbs instead of table salt or sea salt. Check with your health care provider or pharmacist before using salt substitutes. ?Cook with plant-based oils, such as canola, sunflower, or olive oil. ?Meal planning ?When eating at a restaurant, ask that your food be prepared with less salt or no salt, if possible. Avoid dishes labeled as brined, pickled, cured, smoked, or made with soy sauce, miso, or teriyaki sauce. ?Avoid foods that contain MSG (monosodium glutamate). MSG is sometimes added to [place] food, bouillon, and some canned foods. ?Make meals that can be  grilled, baked, poached, roasted, or steamed. These are generally made with less sodium. ?General information ?Most people on this plan should limit their sodium intake to 1,500-2,000 mg (milligrams) of sodium each day. ?What foods should I eat? ?Fruits ?Fresh, frozen, or canned fruit. Fruit juice. ?Vegetables ?Fresh or frozen vegetables. "No salt added" canned  vegetables. "No salt added" tomato sauce and paste. Low-sodium or reduced-sodium tomato and vegetable juice. ?Grains ?Low-sodium cereals, including oats, puffed wheat and rice, and shredded wheat. Low-sodium crackers. Unsalted rice. Unsalted pasta. Low-sodium bread. Whole-grain breads and whole-grain pasta. ?Meats and other proteins ?Fresh or frozen (no salt added) meat, poultry, seafood, and fish. Low-sodium canned tuna and salmon. Unsalted nuts. Dried peas, beans, and lentils without added salt. Unsalted canned beans. Eggs. Unsalted nut butters. ?Dairy ?Milk. Soy milk. Cheese that is naturally low in sodium, such as ricotta cheese, fresh mozzarella, or Swiss cheese. Low-sodium or reduced-sodium cheese. Cream cheese. Yogurt. ?Seasonings and condiments ?Fresh and dried herbs and spices. Salt-free seasonings. Low-sodium mustard and ketchup. Sodium-free salad dressing. Sodium-free light mayonnaise. Fresh or refrigerated horseradish. Lemon juice. Vinegar. ?Other foods ?Homemade, reduced-sodium, or low-sodium soups. Unsalted popcorn and pretzels. Low-salt or salt-free chips. ?The items listed above may not be a complete list of foods and beverages you can eat. Contact a dietitian for more information. ?What foods should I avoid? ?Vegetables ?Sauerkraut, pickled vegetables, and relishes. Olives. [place] fries. Onion rings. Regular canned vegetables (not low-sodium or reduced-sodium). Regular canned tomato sauce and paste (not low-sodium or reduced-sodium). Regular tomato and vegetable juice (not low-sodium or reduced-sodium). Frozen vegetables in sauces. ?Grains ?Instant hot cereals. Bread stuffing, pancake, and biscuit mixes. Croutons. Seasoned rice or pasta mixes. Noodle soup cups. Boxed or frozen macaroni and cheese. Regular salted crackers. Self-rising flour. ?Meats and other proteins ?Meat or fish that is salted, canned, smoked, spiced, or pickled. Precooked or cured meat, such as sausages or meat loaves. Berniece Salines. Ham.  Pepperoni. Hot dogs. Corned beef. Chipped beef. Salt pork. Jerky. Pickled herring. Anchovies and sardines. Regular canned tuna. Salted nuts. ?Dairy ?Processed cheese and cheese spreads. Hard cheeses. Cheese curds. Blue cheese. Feta cheese. String cheese. Regular cottage cheese. Buttermilk. Canned milk. ?Fats and oils ?Salted butter. Regular margarine. Ghee. Bacon fat. ?Seasonings and condiments ?Onion salt, garlic salt, seasoned salt, table salt, and sea salt. Canned and packaged gravies. Worcestershire sauce. Tartar sauce. Barbecue sauce. Teriyaki sauce. Soy sauce, including reduced-sodium. Steak sauce. Fish sauce. Oyster sauce. Cocktail sauce. Horseradish that you find on the shelf. Regular ketchup and mustard. Meat flavorings and tenderizers. Bouillon cubes. Hot sauce. Pre-made or packaged marinades. Pre-made or packaged taco seasonings. Relishes. Regular salad dressings. Salsa. ?Other foods ?Salted popcorn and pretzels. Corn chips and puffs. Potato and tortilla chips. Canned or dried soups. Pizza. Frozen entrees and pot pies. ?The items listed above may not be a complete list of foods and beverages you should avoid. Contact a dietitian for more information. ?Summary ?Eating less sodium can help lower your blood pressure, reduce swelling, and protect your heart, liver, and kidneys. ?Most people on this plan should limit their sodium intake to 1,500-2,000 mg (milligrams) of sodium each day. ?Canned, boxed, and frozen foods are high in sodium. Restaurant foods, fast foods, and pizza are also very high in sodium. You also get sodium by adding salt to food. ?Try to cook at home, eat more fresh fruits and vegetables, and eat less fast food and canned, processed, or prepared foods. ?This information is not intended to replace advice given to you  by your health care provider. Make sure you discuss any questions you have with your health care provider. ?Document Revised: 02/06/2019 Document Reviewed: 12/03/2018 ?Elsevier  Patient Education ? 2022 [place]. ? ? ?

## 2021-03-22 NOTE — Progress Notes (Signed)
Cardiology consultation Note:    Date:  03/26/2021   ID:  [name] [name], DOB [birth year], MRN [id number]  PCP:  [name] Neer, MD  Cardiologist:  [name] Klein, MD    Referring MD: [name] Neer, MD  Chief Complaint  Patient presents with   Follow-up    12 months.   Edema    Legs and ankles.    History of Present Illness:    [name] [name] is a 71 y.o. male with a hx of radiofrequency ablation for atrial fibrillation in August 2018, with excellent clinical result, but with documented episodes of asymptomatic recurrent atrial fibrillation.  He also has hyperlipidemia, gastroesophageal reflux disease and remote melanoma resection.  The patient specifically denies any chest pain at rest exertion, dyspnea at rest or with exertion, orthopnea, paroxysmal nocturnal dyspnea, syncope, palpitations, focal neurological deficits, intermittent claudication, unexplained weight gain, cough, hemoptysis or wheezing.  He does have mild (1+) pretibial and ankle swelling.  He does not particularly follow a low-sodium diet.  He does not take NSAIDs.  He has not had falls, injuries or serious bleeding problems on rivaroxaban.  ECG today shows normal sinus rhythm and left anterior fascicular block.  Note that when he presented with atrial fibrillation his ventricular rate was spontaneously well controlled suggesting some degree of conduction system disease.  He also has left anterior fascicular block.  Preablation echocardiogram in June 2018 showed diffuse LV hypokinesis with EF of 40-45%.  Follow-up echo in August 2019 showed normal left ventricular systolic function and no serious valvular abnormalities.  Left atrial size was normal on both studies.  He had a remote normal nuclear stress test in 2008.  He has tried numerous statins for hyperlipidemia (including simvastatin, pravastatin, rosuvastatin) and the only one he has tolerated is lovastatin in a dose of 40 mg or less.   Past Medical History:   Diagnosis Date   Arthritis    Atrial fibrillation ([place])    Cancer ([place]) 2015   melanoma removed from back   GERD (gastroesophageal reflux disease)    History of cardiac dysrhythmia 2018   ablation and cardioversion done   Hyperlipidemia    Nonischemic cardiomyopathy ([place])    Scab    right elbow healing   Umbilical hernia     Past Surgical History:  Procedure Laterality Date   ATRIAL FIBRILLATION ABLATION N/A 09/07/2016   Procedure: Atrial Fibrillation Ablation;  Surgeon: Constance Haw, MD;  Location: [facility];  Service: Cardiovascular;  Laterality: N/A;   CARDIOVERSION N/A 07/23/2016   Procedure: CARDIOVERSION;  Surgeon: [name] Klein, MD;  Location: [facility];  Service: Cardiovascular;  Laterality: N/A;   CARDIOVERSION N/A 10/03/2016   Procedure: CARDIOVERSION;  Surgeon: Josue Hector, MD;  Location: [facility];  Service: Cardiovascular;  Laterality: N/A;   HERNIA REPAIR  at birth   [place] Bilateral 10/22/2019   Procedure: LAPAROSCOPIC BILATERAL INGUINAL HERNIA REPAIR WITH MESH;  Surgeon: Kinsinger, Arta Bruce, MD;  Location: [place];  Service: General;  Laterality: Bilateral;   LUMBAR LAMINECTOMY/DECOMPRESSION MICRODISCECTOMY Right 10/25/2020   Procedure: MICRODISCECTOMY LUMBAR FIVE-SACRAL ONE;  Surgeon: Consuella Lose, MD;  Location: [place];  Service: Neurosurgery;  Laterality: Right;   UMBILICAL HERNIA REPAIR N/A 10/22/2019   Procedure: HERNIA REPAIR UMBILICAL ADULT;  Surgeon: Kinsinger, Arta Bruce, MD;  [facility];  Service: General;  Laterality: N/A;    Current Medications: Current Meds  Medication Sig   azithromycin (ZITHROMAX) 250 MG tablet Take  1 tablet (250 mg total) by mouth daily. Take first 2 tablets together, then 1 every day until finished.   cholecalciferol (VITAMIN D) 25 MCG (1000 UNIT) tablet Take 1,000 mcg by mouth daily.   ezetimibe (ZETIA) 10 MG tablet Take 1 tablet (10 mg  total) by mouth daily.   lovastatin (MEVACOR) 40 MG tablet Take 40 mg by mouth daily.   Magnesium 250 MG TABS Take 250 mg by mouth daily.   omeprazole (PRILOSEC) 20 MG capsule Take 20 mg by mouth daily.   rivaroxaban (XARELTO) 20 MG TABS tablet TAKE 1 TABLET DAILY WITH SUPPER   [DISCONTINUED] cyclobenzaprine (FLEXERIL) 10 MG tablet Take 1 tablet (10 mg total) by mouth 3 (three) times daily as needed for muscle spasms.   [DISCONTINUED] pregabalin (LYRICA) 100 MG capsule Take 100 mg by mouth 3 (three) times daily.     Allergies:   Crestor [rosuvastatin], Pravastatin sodium, Sertraline hcl, and Simvastatin   Social History   Socioeconomic History   Marital status: Married    Spouse name: Not on file   Number of children: Not on file   Years of education: Not on file   Highest education level: Not on file  Occupational History   Not on file  Tobacco Use   Smoking status: Never   Smokeless tobacco: Never  Vaping Use   Vaping Use: Never used  Substance and Sexual Activity   Alcohol use: Not Currently    Comment: 1-2 beers per day   Drug use: No   Sexual activity: Not on file  Other Topics Concern   Not on file  Social History Narrative   Not on file   Social Determinants of Health   Financial Resource Strain: Not on file  Food Insecurity: Not on file  Transportation Needs: Not on file  Physical Activity: Not on file  Stress: Not on file  Social Connections: Not on file     Family History: The patient's family history includes Alzheimer's disease in his sister; Heart Problems in his brother, brother, and father; Stroke in his brother. ROS:   Please see the history of present illness.    All other systems are reviewed and are negative.   EKGs/Labs/Other Studies Reviewed:    The following studies were reviewed today: Echocardiogram 11 04/03/2008    1. Left ventricular ejection fraction, by estimation, is 60 to 65%. The left ventricle has normal function. The left  ventricle has no regional wall motion abnormalities. Left ventricular diastolic parameters are consistent with Grade II diastolic dysfunction (pseudonormalization). Elevated left ventricular end-diastolic pressure. The average left ventricular global longitudinal strain is -24.7%. The global longitudinal strain is normal.   2. Right ventricular systolic function is normal. The right ventricular size is normal. There is normal pulmonary artery systolic pressure.   3. The mitral valve is normal in structure. Trivial mitral valve  regurgitation. No evidence of mitral stenosis.   4. The aortic valve is tricuspid. Aortic valve regurgitation is not  visualized. No aortic stenosis is present.   5. Aortic dilatation noted. There is mild dilatation of the ascending  aorta, measuring 41 mm.   6. The inferior vena cava is normal in size with greater than 50%  respiratory variability, suggesting right atrial pressure of 3 mmHg.   Comparison(s): 09/09/17 EF 55-60%.   EKG:  EKG is ordered today and shows normal sinus rhythm, left anterior fascicular block, relatively low voltage (possibly due to obesity), QTc 435 ms   Recent Labs: 05/30/2016 hemoglobin  15.4, glucose 126, BUN 18, creatinine 1.1, potassium 3.8, normal liver function tests, TSH 2.6 Hemoglobin A1c 5.8%  10/25/2020 Hemoglobin 14.8, creatinine 0.94, potassium 4.1, ALT 38 BMET    Component Value Date/Time   NA 134 (L) 10/25/2020 1026   NA 143 09/04/2016 1457   K 4.1 10/25/2020 1026   CL 103 10/25/2020 1026   CO2 23 10/25/2020 1026   GLUCOSE 101 (H) 10/25/2020 1026   BUN 10 10/25/2020 1026   BUN 14 09/04/2016 1457   CREATININE 0.94 10/25/2020 1026   CALCIUM 8.9 10/25/2020 1026   GFRNONAA >60 10/25/2020 1026   GFRAA >60 09/27/2016 1007    10/17/2020 Total cholesterol 183, triglycerides 101, HDL 48, LDL 117  Lipid Panel     Component Value Date/Time   CHOL 182 09/13/2017 0740   TRIG 109 09/13/2017 0740   HDL 45 09/13/2017 0740    CHOLHDL 4.0 09/13/2017 0740   LDLCALC 115 (H) 09/13/2017 0740   LABVLDL 22 09/13/2017 0740     Physical Exam:    VS:  BP 122/78 (BP Location: Left Arm, Patient Position: Sitting, Cuff Size: Large)    Pulse 67    Ht '5\' 8"'$  (1.727 m)    Wt 252 lb (114.3 kg)    BMI 38.32 kg/m     Wt Readings from Last 3 Encounters:  03/22/21 252 lb (114.3 kg)  10/25/20 245 lb (111.1 kg)  02/18/20 240 lb (108.9 kg)     General: Alert, oriented x3, no distress, severely obese Head: no evidence of trauma, PERRL, EOMI, no exophtalmos or lid lag, no myxedema, no xanthelasma; normal ears, nose and oropharynx Neck: normal jugular venous pulsations and no hepatojugular reflux; brisk carotid pulses without delay and no carotid bruits Chest: clear to auscultation, no signs of consolidation by percussion or palpation, normal fremitus, symmetrical and full respiratory excursions Cardiovascular: normal position and quality of the apical impulse, regular rhythm, normal first and second heart sounds, no murmurs, rubs or gallops Abdomen: no tenderness or distention, no masses by palpation, no abnormal pulsatility or arterial bruits, normal bowel sounds, no hepatosplenomegaly Extremities: no clubbing, cyanosis; symmetrical 1+ pretibial and ankle swelling; 2+ radial, ulnar and brachial pulses bilaterally; 2+ right femoral, posterior tibial and dorsalis pedis pulses; 2+ left femoral, posterior tibial and dorsalis pedis pulses; no subclavian or femoral bruits Neurological: grossly nonfocal Psych: Normal mood and affect    ASSESSMENT:    1. Paroxysmal atrial fibrillation (HCC)   2. Chronic diastolic heart failure ([place])   3. Hypercholesterolemia   4. Class 2 severe obesity with serious comorbidity and body mass index (BMI) of 38.0 to 38.9 in adult, unspecified obesity type ([place])   5. Acquired thrombophilia ([place])   6. Ascending aorta dilatation (HCC)   7. Elevated coronary artery calcium score     PLAN:    In order  of problems listed above:  1. AFib: No clinically noticeable events since his ablation.  He has had asymptomatic recurrences and he is on anticoagulants.  CHA2DS2-VASc score 2-3 (age, history of preserved left ventricular systolic function) clinically his had an excellent response to ablation.  2. 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  No clinical symptoms of heart failure other than lower extremity edema, which is likely due to peripheral venous insufficiency rather than heart failure.  Did have evidence of diastolic dysfunction on the echo.  At this point, would recommend paying much more  attention to a sodium restricted diet.  I do not think we should prescribe diuretics yet. 3. HLP: Although LDL was not at target, he has not been able to tolerate more potent statins.  He did have a calcium score of 370 (80th percentile for age/gender) in 2018.  He does not have angina pectoris. 4. Severe obesity: He does not have daytime hypersomnolence or other symptoms of obstructive sleep apnea.  Reviewed the negative impact that his weight has been increasing atrial fibrillation burden, increased risk of congestive heart failure and other cardiovascular complications.  Strongly encouraged weight loss. 5. Anticoagulation: No bleeding complications. 6. Mild dilation of ascending aorta: Reevaluate next year with imaging study, preferably CT. 7.  Elevated coronary calcium score: A little difficult to interpret.  This was moderately elevated and he'd already been on treatment with statins (albeit not very successfully) for years when the study was performed.   Medication Adjustments/Labs and Tests Ordered: Current medicines are reviewed at length with the patient today.  Concerns regarding medicines are outlined above. Labs and tests ordered and medication changes are outlined in the patient  instructions below:  Patient Instructions  Medication Instructions:  START Zetia 10 mg once daily  *If you need a refill on your cardiac medications before your next appointment, please call your pharmacy*   Lab Work: None ordered If you have labs (blood work) drawn today and your tests are completely normal, you will receive your results only by: [place] (if you have MyChart) OR A paper copy in the mail If you have any lab test that is abnormal or we need to change your treatment, we will call you to review the results.   Testing/Procedures: None ordered   Follow-Up: At [facility], you and your health needs are our priority.  As part of our continuing mission to provide you with exceptional heart care, we have created designated Provider Care Teams.  These Care Teams include your primary Cardiologist (physician) and Advanced Practice Providers (APPs -  Physician Assistants and Nurse Practitioners) who all work together to provide you with the care you need, when you need it.  We recommend signing up for the patient portal called "MyChart".  Sign up information is provided on this After Visit Summary.  MyChart is used to connect with patients for Virtual Visits (Telemedicine).  Patients are able to view lab/test results, encounter notes, upcoming appointments, etc.  Non-urgent messages can be sent to your provider as well.   To learn more about what you can do with MyChart, go to [email or web address].    Your next appointment:   12 month(s)  The format for your next appointment:   In Person  Provider:   Sanda Klein, MD {  Other Instructions Low-Sodium Eating Plan Sodium, which is an element that makes up salt, helps you maintain a healthy balance of fluids in your body. Too much sodium can increase your blood pressure and cause fluid and waste to be held in your body. Your health care provider or dietitian may recommend following this plan if you have high  blood pressure (hypertension), kidney disease, liver disease, or heart failure. Eating less sodium can help lower your blood pressure, reduce swelling, and protect your heart, liver, and kidneys. What are tips for following this plan? Reading food labels The Nutrition Facts label lists the amount of sodium in one serving of the food. If you eat more than one serving, you must multiply the listed amount of sodium by the number  of servings. Choose foods with less than 140 mg of sodium per serving. Avoid foods with 300 mg of sodium or more per serving. Shopping  Look for lower-sodium products, often labeled as "low-sodium" or "no salt added." Always check the sodium content, even if foods are labeled as "unsalted" or "no salt added." Buy fresh foods. Avoid canned foods and pre-made or frozen meals. Avoid canned, cured, or processed meats. Buy breads that have less than 80 mg of sodium per slice. Cooking  Eat more home-cooked food and less restaurant, buffet, and fast food. Avoid adding salt when cooking. Use salt-free seasonings or herbs instead of table salt or sea salt. Check with your health care provider or pharmacist before using salt substitutes. Cook with plant-based oils, such as canola, sunflower, or olive oil. Meal planning When eating at a restaurant, ask that your food be prepared with less salt or no salt, if possible. Avoid dishes labeled as brined, pickled, cured, smoked, or made with soy sauce, miso, or teriyaki sauce. Avoid foods that contain MSG (monosodium glutamate). MSG is sometimes added to [place] food, bouillon, and some canned foods. Make meals that can be grilled, baked, poached, roasted, or steamed. These are generally made with less sodium. General information Most people on this plan should limit their sodium intake to 1,500-2,000 mg (milligrams) of sodium each day. What foods should I eat? Fruits Fresh, frozen, or canned fruit. Fruit juice. Vegetables Fresh or  frozen vegetables. "No salt added" canned vegetables. "No salt added" tomato sauce and paste. Low-sodium or reduced-sodium tomato and vegetable juice. Grains Low-sodium cereals, including oats, puffed wheat and rice, and shredded wheat. Low-sodium crackers. Unsalted rice. Unsalted pasta. Low-sodium bread. Whole-grain breads and whole-grain pasta. Meats and other proteins Fresh or frozen (no salt added) meat, poultry, seafood, and fish. Low-sodium canned tuna and salmon. Unsalted nuts. Dried peas, beans, and lentils without added salt. Unsalted canned beans. Eggs. Unsalted nut butters. Dairy Milk. Soy milk. Cheese that is naturally low in sodium, such as ricotta cheese, fresh mozzarella, or Swiss cheese. Low-sodium or reduced-sodium cheese. Cream cheese. Yogurt. Seasonings and condiments Fresh and dried herbs and spices. Salt-free seasonings. Low-sodium mustard and ketchup. Sodium-free salad dressing. Sodium-free light mayonnaise. Fresh or refrigerated horseradish. Lemon juice. Vinegar. Other foods Homemade, reduced-sodium, or low-sodium soups. Unsalted popcorn and pretzels. Low-salt or salt-free chips. The items listed above may not be a complete list of foods and beverages you can eat. Contact a dietitian for more information. What foods should I avoid? Vegetables Sauerkraut, pickled vegetables, and relishes. Olives. [place] fries. Onion rings. Regular canned vegetables (not low-sodium or reduced-sodium). Regular canned tomato sauce and paste (not low-sodium or reduced-sodium). Regular tomato and vegetable juice (not low-sodium or reduced-sodium). Frozen vegetables in sauces. Grains Instant hot cereals. Bread stuffing, pancake, and biscuit mixes. Croutons. Seasoned rice or pasta mixes. Noodle soup cups. Boxed or frozen macaroni and cheese. Regular salted crackers. Self-rising flour. Meats and other proteins Meat or fish that is salted, canned, smoked, spiced, or pickled. Precooked or cured meat,  such as sausages or meat loaves. [place] Salines. Ham. Pepperoni. Hot dogs. Corned beef. Chipped beef. Salt pork. Jerky. Pickled herring. Anchovies and sardines. Regular canned tuna. Salted nuts. Dairy Processed cheese and cheese spreads. Hard cheeses. Cheese curds. Blue cheese. Feta cheese. String cheese. Regular cottage cheese. Buttermilk. Canned milk. Fats and oils Salted butter. Regular margarine. Ghee. Bacon fat. Seasonings and condiments Onion salt, garlic salt, seasoned salt, table salt, and sea salt. Canned and packaged gravies. Worcestershire sauce.  Tartar sauce. Barbecue sauce. Teriyaki sauce. Soy sauce, including reduced-sodium. Steak sauce. Fish sauce. Oyster sauce. Cocktail sauce. Horseradish that you find on the shelf. Regular ketchup and mustard. Meat flavorings and tenderizers. Bouillon cubes. Hot sauce. Pre-made or packaged marinades. Pre-made or packaged taco seasonings. Relishes. Regular salad dressings. Salsa. Other foods Salted popcorn and pretzels. Corn chips and puffs. Potato and tortilla chips. Canned or dried soups. Pizza. Frozen entrees and pot pies. The items listed above may not be a complete list of foods and beverages you should avoid. Contact a dietitian for more information. Summary Eating less sodium can help lower your blood pressure, reduce swelling, and protect your heart, liver, and kidneys. Most people on this plan should limit their sodium intake to 1,500-2,000 mg (milligrams) of sodium each day. Canned, boxed, and frozen foods are high in sodium. Restaurant foods, fast foods, and pizza are also very high in sodium. You also get sodium by adding salt to food. Try to cook at home, eat more fresh fruits and vegetables, and eat less fast food and canned, processed, or prepared foods. This information is not intended to replace advice given to you by your health care provider. Make sure you discuss any questions you have with your health care provider. Document Revised:  02/06/2019 Document Reviewed: 12/03/2018 Elsevier Patient Education  2022 [place], [name] Klein, MD  03/26/2021 1:29 PM    [facility]

## 2021-03-26 ENCOUNTER — Encounter: Payer: Self-pay | Admitting: Cardiovascular Disease

## 2021-05-03 DIAGNOSIS — D6869 Other thrombophilia: Secondary | ICD-10-CM | POA: Diagnosis not present

## 2021-05-03 DIAGNOSIS — Z0000 Encounter for general adult medical examination without abnormal findings: Secondary | ICD-10-CM | POA: Diagnosis not present

## 2021-05-03 DIAGNOSIS — I4891 Unspecified atrial fibrillation: Secondary | ICD-10-CM | POA: Diagnosis not present

## 2021-05-03 DIAGNOSIS — K219 Gastro-esophageal reflux disease without esophagitis: Secondary | ICD-10-CM | POA: Diagnosis not present

## 2021-05-03 DIAGNOSIS — Z1211 Encounter for screening for malignant neoplasm of colon: Secondary | ICD-10-CM | POA: Diagnosis not present

## 2021-05-03 DIAGNOSIS — Z125 Encounter for screening for malignant neoplasm of prostate: Secondary | ICD-10-CM | POA: Diagnosis not present

## 2021-05-03 DIAGNOSIS — R7309 Other abnormal glucose: Secondary | ICD-10-CM | POA: Diagnosis not present

## 2021-05-03 DIAGNOSIS — E7800 Pure hypercholesterolemia, unspecified: Secondary | ICD-10-CM | POA: Diagnosis not present

## 2021-05-15 ENCOUNTER — Telehealth: Payer: Self-pay | Admitting: Cardiovascular Disease

## 2021-05-15 DIAGNOSIS — I4819 Other persistent atrial fibrillation: Secondary | ICD-10-CM

## 2021-05-15 NOTE — Telephone Encounter (Signed)
?*  STAT* If patient is at the pharmacy, call can be transferred to refill team. ? ? ?1. Which medications need to be refilled? (please list name of each medication and dose if known)   ? rivaroxaban (XARELTO) 20 MG TABS tablet  ? ? ?2. Which pharmacy/location (including street and city if local pharmacy) is medication to be sent to? [facility], [place] ? ?3. Do they need a 30 day or 90 day supply?  ?90 day ? ?

## 2021-05-16 MED ORDER — RIVAROXABAN 20 MG PO TABS
20.0000 mg | ORAL_TABLET | Freq: Every day | ORAL | 1 refills | Status: DC
Start: 2021-05-16 — End: 2021-12-20

## 2021-05-16 NOTE — Telephone Encounter (Signed)
Prescription refill request for Xarelto received.  ?Indication: a fib ?Last office visit: 03/22/21 ?Weight: 252 ?Age:71 ?Scr: 0.94 ?CrCl: 120 mL/min ?

## 2021-08-08 DIAGNOSIS — L821 Other seborrheic keratosis: Secondary | ICD-10-CM | POA: Diagnosis not present

## 2021-08-08 DIAGNOSIS — M62838 Other muscle spasm: Secondary | ICD-10-CM | POA: Diagnosis not present

## 2021-11-02 DIAGNOSIS — R7309 Other abnormal glucose: Secondary | ICD-10-CM | POA: Diagnosis not present

## 2021-11-02 DIAGNOSIS — E7800 Pure hypercholesterolemia, unspecified: Secondary | ICD-10-CM | POA: Diagnosis not present

## 2021-11-02 DIAGNOSIS — L570 Actinic keratosis: Secondary | ICD-10-CM | POA: Diagnosis not present

## 2021-11-27 ENCOUNTER — Telehealth: Payer: Self-pay | Admitting: Cardiovascular Disease

## 2021-11-27 NOTE — Telephone Encounter (Signed)
   Pre-operative Risk Assessment    Patient Name: [name] [name]  DOB: 1950/12/20 MRN: [id number]      Request for Surgical Clearance    Procedure:   Colonoscopy    Date of Surgery:  Clearance 12/11/21                                 Surgeon:  Dr. Clarene Essex  Surgeon's Group or Practice Name: [facility]    Phone number: [phone]  Fax number:  [phone]   Type of Clearance Requested:   - Medical  - Pharmacy:  Hold Aspirin and Rivaroxaban (Xarelto) Needs to know how long to hold.    Type of Anesthesia:   Propofol    Additional requests/questions:    Signed, April Henson   11/27/2021, 2:41 PM

## 2021-11-27 NOTE — Telephone Encounter (Signed)
Patient with diagnosis of Afib on Xarelto for anticoagulation.    Procedure: colonoscopy Date of procedure: 12/11/21   CHA2DS2-VASc Score = 3  This indicates a 3.2% annual risk of stroke. The patient's score is based upon: CHF History: 1 HTN History: 0 Diabetes History: 0 Stroke History: 0 Vascular Disease History: 1 Age Score: 1 Gender Score: 0    CrCl 91 mL/min usin adjusted weight Platelet count 172K  Per office protocol, patient can hold Xarelto for 1-2 day prior to procedure.

## 2021-11-28 NOTE — Telephone Encounter (Addendum)
   Name: [name] [name]  DOB: 1950-10-19  MRN: [id number]  Primary Cardiologist: Sanda Klein, MD  Chart reviewed as part of pre-operative protocol coverage. Because of [name]'s past medical history and time since last visit, he will require a follow-up telephone visit in order to better assess preoperative cardiovascular risk.  Pre-op covering staff: - Please schedule appointment and call patient to inform them. If patient already had an upcoming appointment within acceptable timeframe, please add "pre-op clearance" to the appointment notes so provider is aware. - Please contact requesting surgeon's office via preferred method (i.e, phone, fax) to inform them of need for appointment prior to surgery.  Anticoagulation was addressed by pharmacy below.   Elgie Collard, PA-C  11/28/2021, 12:58 PM

## 2021-11-28 NOTE — Telephone Encounter (Signed)
S/w the pt and he has been set up for tele pre op appt 12/05/21 @ 2 pm. Med rec and consent are done.

## 2021-11-28 NOTE — Telephone Encounter (Signed)
S/w the pt and he has been set up for tele pre op appt 12/05/21 @ 2 pm. Med rec and consent are done.      Patient Consent for Virtual Visit        [name] has provided verbal consent on 11/28/2021 for a virtual visit (video or telephone).   CONSENT FOR VIRTUAL VISIT FOR:  [name] [name]  By participating in this virtual visit I agree to the following:  I hereby voluntarily request, consent and authorize [facility] and its employed or contracted physicians, physician assistants, nurse practitioners or other licensed health care professionals (the Practitioner), to provide me with telemedicine health care services (the "Services") as deemed necessary by the treating Practitioner. I acknowledge and consent to receive the Services by the Practitioner via telemedicine. I understand that the telemedicine visit will involve communicating with the Practitioner through live audiovisual communication technology and the disclosure of certain medical information by electronic transmission. I acknowledge that I have been given the opportunity to request an in-person assessment or other available alternative prior to the telemedicine visit and am voluntarily participating in the telemedicine visit.  I understand that I have the right to withhold or withdraw my consent to the use of telemedicine in the course of my care at any time, without affecting my right to future care or treatment, and that the Practitioner or I may terminate the telemedicine visit at any time. I understand that I have the right to inspect all information obtained and/or recorded in the course of the telemedicine visit and may receive copies of available information for a reasonable fee.  I understand that some of the potential risks of receiving the Services via telemedicine include:  Delay or interruption in medical evaluation due to technological equipment failure or disruption; Information transmitted may not be  sufficient (e.g. poor resolution of images) to allow for appropriate medical decision making by the Practitioner; and/or  In rare instances, security protocols could fail, causing a breach of personal health information.  Furthermore, I acknowledge that it is my responsibility to provide information about my medical history, conditions and care that is complete and accurate to the best of my ability. I acknowledge that Practitioner's advice, recommendations, and/or decision may be based on factors not within their control, such as incomplete or inaccurate data provided by me or distortions of diagnostic images or specimens that may result from electronic transmissions. I understand that the practice of medicine is not an exact science and that Practitioner makes no warranties or guarantees regarding treatment outcomes. I acknowledge that a copy of this consent can be made available to me via my patient portal ([facility]), or I can request a printed copy by calling the office of [facility].    I understand that my insurance will be billed for this visit.   I have read or had this consent read to me. I understand the contents of this consent, which adequately explains the benefits and risks of the Services being provided via telemedicine.  I have been provided ample opportunity to ask questions regarding this consent and the Services and have had my questions answered to my satisfaction. I give my informed consent for the services to be provided through the use of telemedicine in my medical care

## 2021-12-05 ENCOUNTER — Ambulatory Visit: Payer: Medicare Other | Attending: Internal Medicine | Admitting: General Practice

## 2021-12-05 DIAGNOSIS — Z01810 Encounter for preprocedural cardiovascular examination: Secondary | ICD-10-CM

## 2021-12-05 NOTE — Progress Notes (Signed)
Virtual Visit via Telephone Note   Because of [name] [name]'s co-morbid illnesses, he is at least at moderate risk for complications without adequate follow up.  This format is felt to be most appropriate for this patient at this time.  The patient did not have access to video technology/had technical difficulties with video requiring transitioning to audio format only (telephone).  All issues noted in this document were discussed and addressed.  No physical exam could be performed with this format.  Please refer to the patient's chart for his consent to telehealth for [facility].  Evaluation Performed:  Preoperative cardiovascular risk assessment _____________   Date:  12/05/2021   Patient ID:  KOWEN KLUTH, DOB [birth year], MRN [id number] Patient Location:  Home Provider location:   Office  Primary Care Provider:  Mayra Neer, MD Primary Cardiologist:  Sanda Klein, MD  Chief Complaint / Patient Profile   71 y.o. y/o male with a h/o paroxysmal atrial fibrillation, chronic diastolic CHF, HLD, ascending aortic dilation who is pending colonoscopy and presents today for telephonic preoperative cardiovascular risk assessment.  Past Medical History    Past Medical History:  Diagnosis Date   Arthritis    Atrial fibrillation ([place])    Cancer ([place]) 2015   melanoma removed from back   GERD (gastroesophageal reflux disease)    History of cardiac dysrhythmia 2018   ablation and cardioversion done   Hyperlipidemia    Nonischemic cardiomyopathy ([place])    Scab    right elbow healing   Umbilical hernia    Past Surgical History:  Procedure Laterality Date   ATRIAL FIBRILLATION ABLATION N/A 09/07/2016   Procedure: Atrial Fibrillation Ablation;  Surgeon: Constance Haw, MD;  Location: [facility];  Service: Cardiovascular;  Laterality: N/A;   CARDIOVERSION N/A 07/23/2016   Procedure: CARDIOVERSION;  Surgeon: Sanda Klein, MD;  Location: [facility];   Service: Cardiovascular;  Laterality: N/A;   CARDIOVERSION N/A 10/03/2016   Procedure: CARDIOVERSION;  Surgeon: Josue Hector, MD;  Location: [facility];  Service: Cardiovascular;  Laterality: N/A;   HERNIA REPAIR  at birth   [place] Bilateral 10/22/2019   Procedure: LAPAROSCOPIC BILATERAL INGUINAL HERNIA REPAIR WITH MESH;  Surgeon: Kinsinger, Arta Bruce, MD;  Location: [facility];  Service: General;  Laterality: Bilateral;   LUMBAR LAMINECTOMY/DECOMPRESSION MICRODISCECTOMY Right 10/25/2020   Procedure: MICRODISCECTOMY LUMBAR FIVE-SACRAL ONE;  Surgeon: Consuella Lose, MD;  Location: [facility];  Service: Neurosurgery;  Laterality: Right;   UMBILICAL HERNIA REPAIR N/A 10/22/2019   Procedure: HERNIA REPAIR UMBILICAL ADULT;  Surgeon: Kinsinger, Arta Bruce, MD;  Location: [facility];  Service: General;  Laterality: N/A;    Allergies  Allergies  Allergen Reactions   Crestor [Rosuvastatin] Other (See Comments)    Other reaction(s): Unknown   Pravastatin Sodium Other (See Comments)    Other reaction(s): Unknown   Sertraline Hcl Other (See Comments)    Other reaction(s): Unknown   Simvastatin Other (See Comments)    Other reaction(s): Unknown    History of Present Illness    [name] [name] is a 71 y.o. male who presents via audio/video conferencing for a telehealth visit today.  Pt was last seen in cardiology clinic on 03/22/2021 by Dr. Sallyanne Kuster.  At that time RED MANDT was doing well .  The patient is now pending procedure as outlined above. Since his last visit, he remains stable from a cardiac standpoint.  Today he denies chest pain, shortness of  breath, lower extremity edema, fatigue, palpitations, melena, hematuria, hemoptysis, diaphoresis, weakness, presyncope, syncope, orthopnea, and PND.  Home Medications    Prior to Admission medications   Medication Sig Start Date End Date Taking? Authorizing Provider  azithromycin (ZITHROMAX)  250 MG tablet Take 1 tablet (250 mg total) by mouth daily. Take first 2 tablets together, then 1 every day until finished. 01/11/21   Francene Finders, PA-C  cholecalciferol (VITAMIN D) 25 MCG (1000 UNIT) tablet Take 1,000 mcg by mouth daily.    [provider]  ezetimibe (ZETIA) 10 MG tablet Take 1 tablet (10 mg total) by mouth daily. 03/22/21 06/20/21  Croitoru, Mihai, MD  lovastatin (MEVACOR) 40 MG tablet Take 40 mg by mouth daily.    [provider]  Magnesium 250 MG TABS Take 250 mg by mouth daily.    [provider]  omeprazole (PRILOSEC) 20 MG capsule Take 20 mg by mouth daily.    [provider]  rivaroxaban (XARELTO) 20 MG TABS tablet Take 1 tablet (20 mg total) by mouth daily with supper. 05/16/21   Croitoru, Dani Gobble, MD    Physical Exam    Vital Signs:  TYREES CHOPIN does not have vital signs available for review today.  Given telephonic nature of communication, physical exam is limited. AAOx3. NAD. Normal affect.  Speech and respirations are unlabored.  Accessory Clinical Findings    None  Assessment & Plan    1.  Preoperative Cardiovascular Risk Assessment: Colonoscopy  Primary Cardiologist: Sanda Klein, MD  Chart reviewed as part of pre-operative protocol coverage. Given past medical history and time since last visit, based on ACC/AHA guidelines, KASEN ADDUCI would be at acceptable risk for the planned procedure without further cardiovascular testing.   Patient with diagnosis of Afib on Xarelto for anticoagulation.     Procedure: colonoscopy Date of procedure: 12/11/21     CHA2DS2-VASc Score = 3  This indicates a 3.2% annual risk of stroke. The patient's score is based upon: CHF History: 1 HTN History: 0 Diabetes History: 0 Stroke History: 0 Vascular Disease History: 1 Age Score: 1 Gender Score: 0     CrCl 91 mL/min usin adjusted weight Platelet count 172K   Per office protocol, patient can hold Xarelto for 1-2 day  prior to procedure.    Patient was advised that if he develops new symptoms prior to surgery to contact our office to arrange a follow-up appointment.  He verbalized understanding.  I will route this recommendation to the requesting party via Epic fax function and remove from pre-op pool.       Time:   Today, I have spent 5 minutes with the patient with telehealth technology discussing medical history, symptoms, and management plan.  Prior to his phone evaluation I spent greater than 10 minutes reviewing his past medical history and cardiac medications.   Deberah Pelton, NP  12/05/2021, 7:24 AM

## 2021-12-11 DIAGNOSIS — K5730 Diverticulosis of large intestine without perforation or abscess without bleeding: Secondary | ICD-10-CM | POA: Diagnosis not present

## 2021-12-11 DIAGNOSIS — D120 Benign neoplasm of cecum: Secondary | ICD-10-CM | POA: Diagnosis not present

## 2021-12-11 DIAGNOSIS — Z09 Encounter for follow-up examination after completed treatment for conditions other than malignant neoplasm: Secondary | ICD-10-CM | POA: Diagnosis not present

## 2021-12-11 DIAGNOSIS — Z86010 Personal history of colonic polyps: Secondary | ICD-10-CM | POA: Diagnosis not present

## 2021-12-11 DIAGNOSIS — D123 Benign neoplasm of transverse colon: Secondary | ICD-10-CM | POA: Diagnosis not present

## 2021-12-11 DIAGNOSIS — K649 Unspecified hemorrhoids: Secondary | ICD-10-CM | POA: Diagnosis not present

## 2021-12-19 ENCOUNTER — Telehealth: Payer: Self-pay | Admitting: Cardiovascular Disease

## 2021-12-19 DIAGNOSIS — I4819 Other persistent atrial fibrillation: Secondary | ICD-10-CM

## 2021-12-19 NOTE — Telephone Encounter (Signed)
*  STAT* If patient is at the pharmacy, call can be transferred to refill team.   1. Which medications need to be refilled? (please list name of each medication and dose if known) rivaroxaban (XARELTO) 20 MG TABS tablet   2. Which pharmacy/location (including street and city if local pharmacy) is medication to be sent to?  [facility], [place]    3. Do they need a 30 day or 90 day supply? [place]

## 2021-12-20 MED ORDER — RIVAROXABAN 20 MG PO TABS: 20.0000 mg | ORAL_TABLET | Freq: Every day | ORAL | 3 refills | Status: DC

## 2021-12-20 NOTE — Telephone Encounter (Signed)
Prescription refill request for Xarelto received.  Indication: Afib  Last office visit: 11/04/21 [name] [name])  Weight: 114.3kg Age: 71 Scr: 0.91 (11/02/21 via [facility])  CrCl: 123.45m/min  Appropriate dose and refill sent to requested pharmacy.

## 2021-12-21 DIAGNOSIS — R413 Other amnesia: Secondary | ICD-10-CM | POA: Diagnosis not present

## 2021-12-29 ENCOUNTER — Ambulatory Visit
Admission: EM | Admit: 2021-12-29 | Discharge: 2021-12-29 | Disposition: A | Discharge status: Home / Self Care | Payer: Medicare Other | Attending: Physician Assistant | Admitting: Physician Assistant

## 2021-12-29 ENCOUNTER — Encounter: Payer: Self-pay | Admitting: Emergency Medicine

## 2021-12-29 DIAGNOSIS — R509 Fever, unspecified: Secondary | ICD-10-CM

## 2021-12-29 DIAGNOSIS — J209 Acute bronchitis, unspecified: Secondary | ICD-10-CM

## 2021-12-29 DIAGNOSIS — J069 Acute upper respiratory infection, unspecified: Secondary | ICD-10-CM

## 2021-12-29 DIAGNOSIS — J111 Influenza due to unidentified influenza virus with other respiratory manifestations: Secondary | ICD-10-CM

## 2021-12-29 LAB — POCT INFLUENZA A/B
Influenza A, POC: POSITIVE — AB
Influenza B, POC: NEGATIVE

## 2021-12-29 MED ORDER — ALBUTEROL SULFATE HFA 108 (90 BASE) MCG/ACT IN AERS: 1.0000 | INHALATION_SPRAY | Freq: Four times a day (QID) | RESPIRATORY_TRACT | 0 refills | Status: AC | PRN

## 2021-12-29 MED ORDER — HYDROCODONE BIT-HOMATROP MBR 5-1.5 MG/5ML PO SOLN: 5.0000 mL | Freq: Four times a day (QID) | ORAL | 0 refills | Status: DC | PRN

## 2021-12-29 MED ORDER — OSELTAMIVIR PHOSPHATE 75 MG PO CAPS: 75.0000 mg | ORAL_CAPSULE | Freq: Two times a day (BID) | ORAL | 0 refills | Status: DC

## 2021-12-29 MED ORDER — AZITHROMYCIN 250 MG PO TABS: 250.0000 mg | ORAL_TABLET | Freq: Every day | ORAL | 0 refills | Status: DC

## 2021-12-29 NOTE — ED Provider Notes (Signed)
[name]    CSN: [id number] Arrival date & time: 12/29/21  1436      History   Chief Complaint Chief Complaint  Patient presents with   Cough    Chest congestion   Generalized Body Aches    HPI KEYVON HERTER is a 71 y.o. male.   71 year old male presents with cough and chest congestion.  Patient indicates for the past 3 days he has been having fever, chills, sweats, fatigue and bodyaches.  Patient also indicates he has been having some upper respiratory congestion, postnasal drip and rhinitis which is mainly been purulent.  Patient indicates he has had some cough and chest congestion with production being purulent and thick.  He does indicate that the cough is kept him up at night not able to sleep well due to the coughing exacerbations.  He also indicates that he has had some mild intermittent shortness of breath without wheezing.  Patient indicates he has been using some OTC medications and Mucinex without improvement of his symptoms.  Patient indicates he is tolerating fluids well.  Patient relates he has not been around any family or friends that have been sick.   Cough Associated symptoms: chills and fever     Past Medical History:  Diagnosis Date   Arthritis    Atrial fibrillation ([place])    Cancer ([place]) 2015   melanoma removed from back   GERD (gastroesophageal reflux disease)    History of cardiac dysrhythmia 2018   ablation and cardioversion done   Hyperlipidemia    Nonischemic cardiomyopathy ([place])    Scab    right elbow healing   Umbilical hernia     Patient Active Problem List   Diagnosis Date Noted   Paroxysmal atrial fibrillation ([place]) 09/07/2016   Family history of coronary artery disease in brother 07/03/2016   Nonischemic cardiomyopathy ([place]) 06/08/2016   Hyperglycemia 06/08/2016   Atrial fibrillation ([place]) 06/08/2016   Hypercholesterolemia 06/08/2016   Moderate obesity 06/08/2016    Past Surgical History:  Procedure Laterality  Date   ATRIAL FIBRILLATION ABLATION N/A 09/07/2016   Procedure: Atrial Fibrillation Ablation;  Surgeon: Constance Haw, MD;  Location: [facility];  Service: Cardiovascular;  Laterality: N/A;   CARDIOVERSION N/A 07/23/2016   Procedure: CARDIOVERSION;  Surgeon: Sanda Klein, MD;  Location: [facility];  Service: Cardiovascular;  Laterality: N/A;   CARDIOVERSION N/A 10/03/2016   Procedure: CARDIOVERSION;  Surgeon: Josue Hector, MD;  Location: [facility] ENDOSCOPY;  Service: Cardiovascular;  Laterality: N/A;   HERNIA REPAIR  at birth   [place] Bilateral 10/22/2019   Procedure: LAPAROSCOPIC BILATERAL INGUINAL HERNIA REPAIR WITH MESH;  Surgeon: Kinsinger, Arta Bruce, MD;  Location: [facility];  Service: General;  Laterality: Bilateral;   LUMBAR LAMINECTOMY/DECOMPRESSION MICRODISCECTOMY Right 10/25/2020   Procedure: MICRODISCECTOMY LUMBAR FIVE-SACRAL ONE;  Surgeon: Consuella Lose, MD;  Location: [facility];  Service: Neurosurgery;  Laterality: Right;   UMBILICAL HERNIA REPAIR N/A 10/22/2019   Procedure: HERNIA REPAIR UMBILICAL ADULT;  Surgeon: Kinsinger, Arta Bruce, MD;  Location: [facility];  Service: General;  Laterality: N/A;       Home Medications    Prior to Admission medications   Medication Sig Start Date End Date Taking? Authorizing Provider  albuterol (VENTOLIN HFA) 108 (90 Base) MCG/ACT inhaler Inhale 1-2 puffs into the lungs every 6 (six) hours as needed for wheezing or shortness of breath. 12/29/21  Yes Nyoka Lint, PA-C  HYDROcodone bit-homatropine [facility]) 5-1.5 MG/5ML syrup  Take 5 mLs by mouth every 6 (six) hours as needed for cough. 12/29/21  Yes Nyoka Lint, PA-C  oseltamivir (TAMIFLU) 75 MG capsule Take 1 capsule (75 mg total) by mouth every 12 (twelve) hours. 12/29/21  Yes Nyoka Lint, PA-C  azithromycin (ZITHROMAX) 250 MG tablet Take 1 tablet (250 mg total) by mouth daily. Take first 2 tablets together, then 1 every day  until finished. 12/29/21   Nyoka Lint, PA-C  cholecalciferol (VITAMIN D) 25 MCG (1000 UNIT) tablet Take 1,000 mcg by mouth daily.    [provider]  ezetimibe (ZETIA) 10 MG tablet Take 1 tablet (10 mg total) by mouth daily. 03/22/21 06/20/21  Croitoru, Mihai, MD  lovastatin (MEVACOR) 40 MG tablet Take 40 mg by mouth daily.    [provider]  Magnesium 250 MG TABS Take 250 mg by mouth daily.    [provider]  omeprazole (PRILOSEC) 20 MG capsule Take 20 mg by mouth daily.    [provider]  rivaroxaban (XARELTO) 20 MG TABS tablet Take 1 tablet (20 mg total) by mouth daily with supper. 12/20/21   Croitoru, Mihai, MD    Family History Family History  Problem Relation Age of Onset   Heart Problems Father    Alzheimer's disease Sister    Stroke Brother    Heart Problems Brother    Heart Problems Brother     Social History Social History   Tobacco Use   Smoking status: Never   Smokeless tobacco: Never  Vaping Use   Vaping Use: Never used  Substance Use Topics   Alcohol use: Not Currently    Comment: 1-2 beers per day   Drug use: No     Allergies   Crestor [rosuvastatin], Pravastatin sodium, Sertraline hcl, and Simvastatin   Review of Systems Review of Systems  Constitutional:  Positive for chills, fatigue and fever.  Respiratory:  Positive for cough.      Physical Exam Triage Vital Signs ED Triage Vitals  Enc Vitals Group     BP 12/29/21 1544 138/83     Pulse Rate 12/29/21 1544 72     Resp 12/29/21 1544 18     Temp 12/29/21 1544 98.6 F (37 C)     Temp src --      SpO2 12/29/21 1544 96 %     Weight --      Height --      Head Circumference --      Peak Flow --      Pain Score 12/29/21 1539 0     Pain Loc --      Pain Edu? --      Excl. in [place]? --    No data found.  Updated Vital Signs BP 138/83   Pulse 72   Temp 98.6 F (37 C)   Resp 18   SpO2 96%   Visual Acuity Right Eye Distance:   Left Eye Distance:    Bilateral Distance:    Right Eye Near:   Left Eye Near:    Bilateral Near:     Physical Exam Constitutional:      Appearance: Normal appearance.  HENT:     Right Ear: Ear canal normal. Tympanic membrane is injected.     Left Ear: Ear canal normal. Tympanic membrane is injected.     Mouth/Throat:     Mouth: Mucous membranes are moist.     Pharynx: Oropharynx is clear.  Cardiovascular:     Rate and Rhythm: Normal  rate and regular rhythm.     Heart sounds: Normal heart sounds.  Pulmonary:     Effort: Pulmonary effort is normal.     Breath sounds: Normal breath sounds and air entry. No wheezing, rhonchi or rales.  Lymphadenopathy:     Cervical: No cervical adenopathy.  Neurological:     Mental Status: He is alert.      UC Treatments / Results  Labs (all labs ordered are listed, but only abnormal results are displayed) Labs Reviewed  POCT INFLUENZA A/B - Abnormal; Notable for the following components:      Result Value   Influenza A, POC Positive (*)    All other components within normal limits    EKG   Radiology No results found.  Procedures Procedures (including critical care time)  Medications Ordered in UC Medications - No data to display  Initial Impression / Assessment and Plan / UC Course  I have reviewed the triage vital signs and the nursing notes.  Pertinent labs & imaging results that were available during my care of the patient were reviewed by me and considered in my medical decision making (see chart for details).    Plan: 1.  The acute upper respiratory infection will be treated with the following: A.  Hycodan cough syrup, 1 to 2 teaspoons every 6-8 hours needed for cough and congestion. 2.  The acute bronchitis will be treated with the following: A.  Hycodan cough syrup, 1 to 2 teaspoons every 6-8 hours to control cough and congestion. B.  Albuterol inhaler, 2 puffs every 6 hours as needed for wheezing and shortness of breath. C.  Zithromax  250 mg, 2 tablets initially and then 1 tablet daily until medication is completed to treat the respiratory infection. 3.  The fever will be treated with the following: A.  Advised to use Tylenol on a regular basis to help control the fever and the body discomfort. 4.  The flu will be treated with the following: A.  Tamiflu 75 mg every 12 hours for 5 days to treat the viral infection. 5.  Advised follow-up PCP or return to urgent care if symptoms fail to improve. Final Clinical Impressions(s) / UC Diagnoses   Final diagnoses:  Acute upper respiratory infection  Acute bronchitis, unspecified organism  Fever, unspecified  Flu     Discharge Instructions      Advised take Tylenol for fever or discomfort. Advised to use the albuterol inhaler, 2 puffs every 6-8 hours as needed for cough and shortness of breath. Advised to take the Zithromax 250 mg, 2 tablets initially then 1 tablet daily until completed to treat the respiratory infection. Advised to take the Hycodan cough syrup, 1 to 2 teaspoons every 6-8 hours as needed for cough.  (Be cautious with this medication as it does cause drowsiness) Advised to follow-up with PCP or return to urgent care if symptoms fail to improve.    ED Prescriptions     Medication Sig Dispense Auth. Provider   azithromycin (ZITHROMAX) 250 MG tablet Take 1 tablet (250 mg total) by mouth daily. Take first 2 tablets together, then 1 every day until finished. 6 tablet Nyoka Lint, PA-C   albuterol (VENTOLIN HFA) 108 (90 Base) MCG/ACT inhaler Inhale 1-2 puffs into the lungs every 6 (six) hours as needed for wheezing or shortness of breath. 8 g Nyoka Lint, PA-C   HYDROcodone bit-homatropine [facility]) 5-1.5 MG/5ML syrup Take 5 mLs by mouth every 6 (six) hours as needed for cough. Kevin  mL Nyoka Lint, PA-C   oseltamivir (TAMIFLU) 75 MG capsule Take 1 capsule (75 mg total) by mouth every 12 (twelve) hours. 10 capsule Nyoka Lint, PA-C      I have reviewed the  PDMP during this encounter.   Woodruff, [place], PA-C 12/29/21 1629

## 2021-12-29 NOTE — Discharge Instructions (Addendum)
Advised take Tylenol for fever or discomfort. Advised to use the albuterol inhaler, 2 puffs every 6-8 hours as needed for cough and shortness of breath. Advised to take the Zithromax 250 mg, 2 tablets initially then 1 tablet daily until completed to treat the respiratory infection. Advised to take the Hycodan cough syrup, 1 to 2 teaspoons every 6-8 hours as needed for cough.  (Be cautious with this medication as it does cause drowsiness) Advised to follow-up with PCP or return to urgent care if symptoms fail to improve.

## 2021-12-29 NOTE — ED Triage Notes (Signed)
Pt is present today with c/o cough, chest congestion, and body aches x3 days

## 2022-01-02 ENCOUNTER — Ambulatory Visit
Admission: RE | Admit: 2022-01-02 | Discharge: 2022-01-02 | Disposition: A | Discharge status: Home / Self Care | Payer: Medicare Other | Source: Ambulatory Visit | Attending: Physician Assistant | Admitting: Physician Assistant

## 2022-01-02 VITALS — BP 146/80 | HR 74 | Temp 98.0°F | Resp 18

## 2022-01-02 DIAGNOSIS — J111 Influenza due to unidentified influenza virus with other respiratory manifestations: Secondary | ICD-10-CM | POA: Diagnosis not present

## 2022-01-02 MED ORDER — HYDROCODONE BIT-HOMATROP MBR 5-1.5 MG/5ML PO SOLN: 5.0000 mL | Freq: Four times a day (QID) | ORAL | 0 refills | Status: DC | PRN

## 2022-01-02 NOTE — ED Provider Notes (Signed)
[name]    CSN: [id number] Arrival date & time: 01/02/22  1254      History   Chief Complaint Chief Complaint  Patient presents with   APPT: Follow Up    HPI [name] [name] is a 71 y.o. male.   Patient here today for evaluation of continued cough and congestion after testing positive for flu 3 days ago.  He does report some improvement with treatment but states he has a few doses of both antibiotic and antiviral left.  He has ran out of cough syrup which he reports has also been very helpful.  He has not had fever over the last few days.  He does not report  any new symptoms.  The history is provided by the patient.    Past Medical History:  Diagnosis Date   Arthritis    Atrial fibrillation ([place])    Cancer ([place]) 2015   melanoma removed from back   GERD (gastroesophageal reflux disease)    History of cardiac dysrhythmia 2018   ablation and cardioversion done   Hyperlipidemia    Nonischemic cardiomyopathy ([place])    Scab    right elbow healing   Umbilical hernia     Patient Active Problem List   Diagnosis Date Noted   Paroxysmal atrial fibrillation ([place]) 09/07/2016   Family history of coronary artery disease in brother 07/03/2016   Nonischemic cardiomyopathy ([place]) 06/08/2016   Hyperglycemia 06/08/2016   Atrial fibrillation ([place]) 06/08/2016   Hypercholesterolemia 06/08/2016   Moderate obesity 06/08/2016    Past Surgical History:  Procedure Laterality Date   ATRIAL FIBRILLATION ABLATION N/A 09/07/2016   Procedure: Atrial Fibrillation Ablation;  Surgeon: Constance Haw, MD;  Location: [facility];  Service: Cardiovascular;  Laterality: N/A;   CARDIOVERSION N/A 07/23/2016   Procedure: CARDIOVERSION;  Surgeon: Sanda Klein, MD;  Location: [facility];  Service: Cardiovascular;  Laterality: N/A;   CARDIOVERSION N/A 10/03/2016   Procedure: CARDIOVERSION;  Surgeon: Josue Hector, MD;  Location: [facility] ENDOSCOPY;  Service: Cardiovascular;   Laterality: N/A;   HERNIA REPAIR  at birth   [place] Bilateral 10/22/2019   Procedure: LAPAROSCOPIC BILATERAL INGUINAL HERNIA REPAIR WITH MESH;  Surgeon: Kinsinger, Arta Bruce, MD;  Location: [facility];  Service: General;  Laterality: Bilateral;   LUMBAR LAMINECTOMY/DECOMPRESSION MICRODISCECTOMY Right 10/25/2020   Procedure: MICRODISCECTOMY LUMBAR FIVE-SACRAL ONE;  Surgeon: Consuella Lose, MD;  Location: [facility];  Service: Neurosurgery;  Laterality: Right;   UMBILICAL HERNIA REPAIR N/A 10/22/2019   Procedure: HERNIA REPAIR UMBILICAL ADULT;  Surgeon: Kinsinger, Arta Bruce, MD;  Location: [facility];  Service: General;  Laterality: N/A;       Home Medications    Prior to Admission medications   Medication Sig Start Date End Date Taking? Authorizing Provider  albuterol (VENTOLIN HFA) 108 (90 Base) MCG/ACT inhaler Inhale 1-2 puffs into the lungs every 6 (six) hours as needed for wheezing or shortness of breath. 12/29/21   Nyoka Lint, PA-C  azithromycin (ZITHROMAX) 250 MG tablet Take 1 tablet (250 mg total) by mouth daily. Take first 2 tablets together, then 1 every day until finished. 12/29/21   Nyoka Lint, PA-C  cholecalciferol (VITAMIN D) 25 MCG (1000 UNIT) tablet Take 1,000 mcg by mouth daily.    [provider]  ezetimibe (ZETIA) 10 MG tablet Take 1 tablet (10 mg total) by mouth daily. 03/22/21 06/20/21  Croitoru, Mihai, MD  HYDROcodone bit-homatropine (HYCODAN) 5-1.5 MG/5ML syrup Take 5 mLs by  mouth every 6 (six) hours as needed for cough. 01/02/22   Francene Finders, PA-C  lovastatin (MEVACOR) 40 MG tablet Take 40 mg by mouth daily.    [provider]  Magnesium 250 MG TABS Take 250 mg by mouth daily.    [provider]  omeprazole (PRILOSEC) 20 MG capsule Take 20 mg by mouth daily.    [provider]  oseltamivir (TAMIFLU) 75 MG capsule Take 1 capsule (75 mg total) by mouth every 12 (twelve) hours.  12/29/21   Nyoka Lint, PA-C  rivaroxaban (XARELTO) 20 MG TABS tablet Take 1 tablet (20 mg total) by mouth daily with supper. 12/20/21   Croitoru, Mihai, MD    Family History Family History  Problem Relation Age of Onset   Heart Problems Father    Alzheimer's disease Sister    Stroke Brother    Heart Problems Brother    Heart Problems Brother     Social History Social History   Tobacco Use   Smoking status: Never   Smokeless tobacco: Never  Vaping Use   Vaping Use: Never used  Substance Use Topics   Alcohol use: Not Currently    Comment: 1-2 beers per day   Drug use: No     Allergies   Crestor [rosuvastatin], Pravastatin sodium, Sertraline hcl, and Simvastatin   Review of Systems Review of Systems  Constitutional:  Negative for chills and fever.  HENT:  Positive for congestion. Negative for ear pain and sore throat.   Eyes:  Negative for discharge and redness.  Respiratory:  Positive for cough. Negative for shortness of breath.   Gastrointestinal:  Negative for abdominal pain, diarrhea, nausea and vomiting.     Physical Exam Triage Vital Signs ED Triage Vitals  Enc Vitals Group     BP 01/02/22 1407 (!) 81/57     Pulse Rate 01/02/22 1407 74     Resp 01/02/22 1407 18     Temp 01/02/22 1407 98 F (36.7 C)     Temp Source 01/02/22 1407 Oral     SpO2 01/02/22 1407 95 %     Weight --      Height --      Head Circumference --      Peak Flow --      Pain Score 01/02/22 1409 4     Pain Loc --      Pain Edu? --      Excl. in [place]? --    No data found.  Updated Vital Signs BP (!) 146/80   Pulse 74   Temp 98 F (36.7 C) (Oral)   Resp 18   SpO2 95%       Physical Exam Vitals and nursing note reviewed.  Constitutional:      General: He is not in acute distress.    Appearance: Normal appearance. He is not ill-appearing.  HENT:     Head: Normocephalic and atraumatic.     Nose: Congestion present.     Mouth/Throat:     Mouth: Mucous membranes are  moist.     Pharynx: Oropharynx is clear. No oropharyngeal exudate or posterior oropharyngeal erythema.  Eyes:     Conjunctiva/sclera: Conjunctivae normal.  Cardiovascular:     Rate and Rhythm: Normal rate and regular rhythm.     Heart sounds: Normal heart sounds. No murmur heard. Pulmonary:     Effort: Pulmonary effort is normal. No respiratory distress.     Breath sounds: Wheezing (occasional wheeze noted) present. No  rhonchi or rales.  Skin:    General: Skin is warm and dry.  Neurological:     Mental Status: He is alert.  Psychiatric:        Mood and Affect: Mood normal.        Thought Content: Thought content normal.      UC Treatments / Results  Labs (all labs ordered are listed, but only abnormal results are displayed) Labs Reviewed - No data to display  EKG   Radiology No results found.  Procedures Procedures (including critical care time)  Medications Ordered in UC Medications - No data to display  Initial Impression / Assessment and Plan / UC Course  I have reviewed the triage vital signs and the nursing notes.  Pertinent labs & imaging results that were available during my care of the patient were reviewed by me and considered in my medical decision making (see chart for details).    Cough syrup refilled and recommended he complete antiviral and antibacterial course as prescribed.  Encouraged follow up if symptoms do not continue to improve or worsen in any way.   Final Clinical Impressions(s) / UC Diagnoses   Final diagnoses:  Influenza   Discharge Instructions   None    ED Prescriptions     Medication Sig Dispense Auth. Provider   HYDROcodone bit-homatropine (HYCODAN) 5-1.5 MG/5ML syrup Take 5 mLs by mouth every 6 (six) hours as needed for cough. 120 mL Francene Finders, PA-C      I have reviewed the PDMP during this encounter.   Francene Finders, PA-C 01/02/22 1510

## 2022-01-02 NOTE — ED Triage Notes (Signed)
Pt presents with no relief in symptoms after testing positive for flu X 3 days ago; pt states he is out of medication prescribed.

## 2022-05-07 DIAGNOSIS — Z125 Encounter for screening for malignant neoplasm of prostate: Secondary | ICD-10-CM | POA: Diagnosis not present

## 2022-05-07 DIAGNOSIS — Z0000 Encounter for general adult medical examination without abnormal findings: Secondary | ICD-10-CM | POA: Diagnosis not present

## 2022-05-07 DIAGNOSIS — D6869 Other thrombophilia: Secondary | ICD-10-CM | POA: Diagnosis not present

## 2022-05-07 DIAGNOSIS — R7309 Other abnormal glucose: Secondary | ICD-10-CM | POA: Diagnosis not present

## 2022-05-07 DIAGNOSIS — K219 Gastro-esophageal reflux disease without esophagitis: Secondary | ICD-10-CM | POA: Diagnosis not present

## 2022-05-07 DIAGNOSIS — I4891 Unspecified atrial fibrillation: Secondary | ICD-10-CM | POA: Diagnosis not present

## 2022-05-07 DIAGNOSIS — E7800 Pure hypercholesterolemia, unspecified: Secondary | ICD-10-CM | POA: Diagnosis not present

## 2022-08-06 ENCOUNTER — Other Ambulatory Visit: Payer: Self-pay | Admitting: Cardiovascular Disease

## 2022-08-21 ENCOUNTER — Other Ambulatory Visit: Payer: Self-pay | Admitting: Family Medicine

## 2022-08-21 ENCOUNTER — Ambulatory Visit
Admission: RE | Admit: 2022-08-21 | Discharge: 2022-08-21 | Disposition: A | Discharge status: Home / Self Care | Payer: Medicare Other | Source: Ambulatory Visit | Attending: Family Medicine | Admitting: Family Medicine

## 2022-08-21 DIAGNOSIS — M25551 Pain in right hip: Secondary | ICD-10-CM

## 2022-08-21 DIAGNOSIS — M79651 Pain in right thigh: Secondary | ICD-10-CM | POA: Diagnosis not present

## 2022-08-21 DIAGNOSIS — M1711 Unilateral primary osteoarthritis, right knee: Secondary | ICD-10-CM | POA: Diagnosis not present

## 2022-11-08 DIAGNOSIS — R7309 Other abnormal glucose: Secondary | ICD-10-CM | POA: Diagnosis not present

## 2022-11-08 DIAGNOSIS — E7800 Pure hypercholesterolemia, unspecified: Secondary | ICD-10-CM | POA: Diagnosis not present

## 2022-11-08 DIAGNOSIS — J301 Allergic rhinitis due to pollen: Secondary | ICD-10-CM | POA: Diagnosis not present

## 2022-11-08 DIAGNOSIS — G4700 Insomnia, unspecified: Secondary | ICD-10-CM | POA: Diagnosis not present

## 2023-01-20 ENCOUNTER — Ambulatory Visit
Admission: EM | Admit: 2023-01-20 | Discharge: 2023-01-20 | Disposition: A | Discharge status: Home / Self Care | Payer: Medicare Other

## 2023-01-20 DIAGNOSIS — J329 Chronic sinusitis, unspecified: Secondary | ICD-10-CM

## 2023-01-20 DIAGNOSIS — J40 Bronchitis, not specified as acute or chronic: Secondary | ICD-10-CM | POA: Diagnosis not present

## 2023-01-20 MED ORDER — PREDNISONE 20 MG PO TABS
40.0000 mg | ORAL_TABLET | Freq: Every day | ORAL | 0 refills | Status: AC
Start: 2023-01-20 — End: 2023-01-25

## 2023-01-20 MED ORDER — AMOXICILLIN-POT CLAVULANATE 875-125 MG PO TABS: 1.0000 | ORAL_TABLET | Freq: Two times a day (BID) | ORAL | 0 refills | Status: DC

## 2023-01-20 NOTE — ED Triage Notes (Signed)
"  This Cough started the 31st when traveled to [facility]". "Some runny nose and wet cough a lot". No wheezing/sob (known). No fever. "Hoarse voice".

## 2023-01-20 NOTE — ED Provider Notes (Signed)
 [name]    CSN: [id number] Arrival date & time: 01/20/23  0803      History   Chief Complaint Chief Complaint  Patient presents with   Cough   Nasal Congestion    HPI [name] [name] is a 73 y.o. male.   Patient here today for evaluation of cough and congestion that lasted the last 6 days.  He reports that he has had worsening symptoms since onset.  He is unsure of fever but states he might of had 1 last night.  He denies any wheezing or shortness of breath.  He has not had any vomiting or diarrhea.  He has taken multiple over-the-counter medications without resolution.  He does note that he gets bronchitis frequently.  The history is provided by the patient.  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   Paroxysmal atrial fibrillation ([facility]) 09/07/2016   Family history of coronary artery disease in brother 07/03/2016   Nonischemic cardiomyopathy ([facility]) 06/08/2016   Hyperglycemia 06/08/2016   Atrial fibrillation ([facility]) 06/08/2016   Hypercholesterolemia 06/08/2016   Moderate obesity 06/08/2016    Past Surgical History:  Procedure Laterality Date   ATRIAL FIBRILLATION ABLATION N/A 09/07/2016   Procedure: Atrial Fibrillation Ablation;  Surgeon: Inocencio Soyla Lunger, MD;  Location: [facility] INVASIVE CV LAB;  Service: Cardiovascular;  Laterality: N/A;   CARDIOVERSION N/A  07/23/2016   Procedure: CARDIOVERSION;  Surgeon: Francyne Headland, MD;  Location: [facility] ENDOSCOPY;  Service: Cardiovascular;  Laterality: N/A;   CARDIOVERSION N/A 10/03/2016   Procedure: CARDIOVERSION;  Surgeon: Delford Maude BROCKS, MD;  Location: [facility] ENDOSCOPY;  Service: Cardiovascular;  Laterality: N/A;   HERNIA REPAIR  at birth   INGUINAL HERNIA REPAIR Bilateral 10/22/2019   Procedure: LAPAROSCOPIC BILATERAL INGUINAL HERNIA REPAIR WITH MESH;  Surgeon: Kinsinger, Herlene Righter, MD;  Location: [facility];  Service: General;  Laterality: Bilateral;   LUMBAR LAMINECTOMY/DECOMPRESSION MICRODISCECTOMY Right 10/25/2020   Procedure: MICRODISCECTOMY LUMBAR FIVE-SACRAL ONE;  Surgeon: Lanis Pupa, MD;  Location: [facility];  Service: Neurosurgery;  Laterality: Right;   UMBILICAL HERNIA REPAIR N/A 10/22/2019   Procedure: HERNIA REPAIR UMBILICAL ADULT;  Surgeon: Kinsinger, Herlene Righter, MD;  Location: [facility];  Service: General;  Laterality: N/A;       Home Medications    Prior to Admission medications   Medication Sig Start Date End Date Taking? Authorizing Provider  amoxicillin -clavulanate (AUGMENTIN ) 875-125 MG tablet Take 1 tablet by mouth every 12 (twelve) hours. 01/20/23  Yes Billy Asberry FALCON, PA-C  predniSONE  (DELTASONE ) 20 MG tablet Take 2 tablets (40 mg total) by mouth daily with breakfast for 5 days. 01/20/23 01/25/23 Yes Billy Asberry FALCON, PA-C  rivaroxaban  (XARELTO ) 20 MG TABS tablet Take 1 tablet (20 mg total) by mouth daily with supper. 12/20/21  Yes Croitoru, [place], MD  albuterol  (VENTOLIN  HFA) 108 (90 Base) MCG/ACT inhaler Inhale 1-2 puffs into the lungs every 6 (six) hours as needed for wheezing or shortness of breath. 12/29/21   Lynwood Lenis, PA-C  aspirin 81 MG chewable tablet Chew 81 mg by mouth daily. 12/08/18   [provider]  atorvastatin (LIPITOR) 10 MG tablet Take 10 mg by mouth daily. 12/08/18   [provider]  azithromycin  (ZITHROMAX ) 250 MG  tablet Take 1 tablet (250 mg total) by mouth daily. Take first 2 tablets together, then 1 every day until finished. 12/29/21   Lynwood Lenis, PA-C  buPROPion  (WELLBUTRIN  XL) 150 MG 24 hr tablet Take 150 mg by mouth daily. 12/08/18   [provider]  cholecalciferol (VITAMIN D) 25 MCG (1000 UNIT) tablet Take 1,000 mcg by mouth daily.    [provider]  ezetimibe  (ZETIA ) 10 MG tablet TAKE 1 TABLET DAILY 08/06/22   Croitoru, Mihai, MD  HYDROcodone  bit-homatropine (HYCODAN) 5-1.5 MG/5ML syrup Take 5 mLs by mouth every 6 (six) hours as needed for cough. 01/02/22   Billy Asberry FALCON, PA-C  levocetirizine (XYZAL) 5 MG tablet Take 5 mg by mouth every evening. 11/08/22   [provider]  LORazepam (ATIVAN) 1 MG tablet Take 1-2 mg by mouth at bedtime as needed. 11/08/22   [provider]  lovastatin (MEVACOR) 40 MG tablet Take 40 mg by mouth at bedtime. 03/11/18   [provider]  Magnesium 250 MG TABS Take 250 mg by mouth daily.    [provider]  omeprazole (PRILOSEC) 20 MG capsule Take 20 mg by mouth daily.    [provider]  omeprazole (PRILOSEC) 20 MG capsule Take 20 mg by mouth daily. 05/10/15   [provider]  oseltamivir  (TAMIFLU ) 75 MG capsule Take 1 capsule (75 mg total) by mouth every 12 (twelve) hours. 12/29/21   Lynwood Lenis, PA-C    Family History Family History  Problem Relation Age of Onset   Heart Problems Father    Alzheimer's disease Sister    Stroke Brother    Heart Problems Brother    Heart Problems Brother     Social History Social History   Tobacco Use   Smoking status: Never    Passive exposure: Never   Smokeless tobacco: Never  Vaping Use   Vaping status: Never Used  Substance Use Topics   Alcohol use: Not Currently    Comment: 1-2 beers per day   Drug use: No     Allergies   Crestor [rosuvastatin], Pravastatin  sodium, Sertraline hcl, and Simvastatin   Review of Systems Review of Systems   Constitutional:  Positive for chills. Negative for fever.  HENT:  Positive for congestion, sinus pressure, sore throat and voice change. Negative for ear pain.   Eyes:  Negative for discharge and redness.  Respiratory:  Positive for cough. Negative for shortness of breath.   Gastrointestinal:  Negative for abdominal pain, diarrhea, nausea and vomiting.     Physical Exam Triage Vital Signs ED Triage Vitals  Encounter Vitals Group     BP 01/20/23 0828 125/77     Systolic BP Percentile --      Diastolic BP Percentile --      Pulse Rate 01/20/23 0828 77     Resp 01/20/23 0828 20     Temp 01/20/23 0828 98.1 F (36.7 C)     Temp Source 01/20/23 0828 Oral     SpO2 01/20/23 0828 96 %     Weight 01/20/23 0825  235 lb (106.6 kg)     Height 01/20/23 0825 5' 8 (1.727 m)     Head Circumference --      Peak Flow --      Pain Score 01/20/23 0822 0     Pain Loc --      Pain Education --      Exclude from Growth Chart --    No data found.  Updated Vital Signs BP 125/77 (BP Location: Left Arm)   Pulse 77   Temp 98.1 F (36.7 C) (Oral)   Resp 20   Ht 5' 8 (1.727 m)   Wt 235 lb (106.6 kg)   SpO2 96%   BMI 35.73 kg/m     Physical Exam Vitals and nursing note reviewed.  Constitutional:      General: He is not in acute distress.    Appearance: Normal appearance. He is not ill-appearing.  HENT:     Head: Normocephalic and atraumatic.     Right Ear: Tympanic membrane normal.     Left Ear: Tympanic membrane normal.     Nose: Congestion present.     Mouth/Throat:     Mouth: Mucous membranes are moist.     Pharynx: Oropharynx is clear. No oropharyngeal exudate or posterior oropharyngeal erythema.  Eyes:     Conjunctiva/sclera: Conjunctivae normal.  Cardiovascular:     Rate and Rhythm: Normal rate and regular rhythm.     Heart sounds: Normal heart sounds. No murmur heard. Pulmonary:     Effort: Pulmonary effort is normal. No respiratory distress.     Breath sounds: Normal  breath sounds. No wheezing, rhonchi or rales.  Skin:    General: Skin is warm and dry.  Neurological:     Mental Status: He is alert.  Psychiatric:        Mood and Affect: Mood normal.        Thought Content: Thought content normal.      UC Treatments / Results  Labs (all labs ordered are listed, but only abnormal results are displayed) Labs Reviewed - No data to display  EKG   Radiology No results found.  Procedures Procedures (including critical care time)  Medications Ordered in UC Medications - No data to display  Initial Impression / Assessment and Plan / UC Course  I have reviewed the triage vital signs and the nursing notes.  Pertinent labs & imaging results that were available during my care of the patient were reviewed by me and considered in my medical decision making (see chart for details).     Will treat to cover sinobronchitis with steroid burst and Augmentin .  Recommended follow-up if no gradual improvement with any further concerns.   Final Clinical Impressions(s) / UC Diagnoses   Final diagnoses:  Sinobronchitis   Discharge Instructions   None    ED Prescriptions     Medication Sig Dispense Auth. Provider   amoxicillin -clavulanate (AUGMENTIN ) 875-125 MG tablet Take 1 tablet by mouth every 12 (twelve) hours. 14 tablet Billy Stabs F, PA-C   predniSONE  (DELTASONE ) 20 MG tablet Take 2 tablets (40 mg total) by mouth daily with breakfast for 5 days. 10 tablet Billy Stabs FALCON, PA-C      PDMP not reviewed this encounter.   Billy Stabs FALCON, PA-C 01/20/23 [phone]

## 2023-01-23 DIAGNOSIS — J988 Other specified respiratory disorders: Secondary | ICD-10-CM | POA: Diagnosis not present

## 2023-03-07 ENCOUNTER — Ambulatory Visit
Admission: EM | Admit: 2023-03-07 | Discharge: 2023-03-07 | Disposition: A | Discharge status: Home / Self Care | Payer: Medicare Other | Attending: Family Medicine | Admitting: Family Medicine

## 2023-03-07 ENCOUNTER — Encounter: Payer: Self-pay | Admitting: Emergency Medicine

## 2023-03-07 DIAGNOSIS — U071 COVID-19: Secondary | ICD-10-CM | POA: Diagnosis not present

## 2023-03-07 LAB — POC COVID19/FLU A&B COMBO
Covid Antigen, POC: POSITIVE — AB
Influenza A Antigen, POC: NEGATIVE
Influenza B Antigen, POC: NEGATIVE

## 2023-03-07 MED ORDER — BENZONATATE 100 MG PO CAPS: 100.0000 mg | ORAL_CAPSULE | Freq: Three times a day (TID) | ORAL | 0 refills | Status: DC | PRN

## 2023-03-07 MED ORDER — MOLNUPIRAVIR EUA 200MG CAPSULE: 4.0000 | ORAL_CAPSULE | Freq: Two times a day (BID) | ORAL | 0 refills | Status: AC

## 2023-03-07 NOTE — Discharge Instructions (Signed)
 Your test is positive for COVID; it is negative for flu.  Take molnupiravir--4 capsules by mouth 2 times daily for 5 days  Take benzonatate 100 mg, 1 tab every 8 hours as needed for cough.  Your vital signs are currently good and reassuring.  If you start feeling weaker or more short of breath, please consider going to the emergency room for further evaluation.

## 2023-03-07 NOTE — ED Provider Notes (Signed)
 [name]    CSN: [id number] Arrival date & time: 03/07/23  1450      History   Chief Complaint Chief Complaint  Patient presents with   Nasal Congestion   Cough   Fatigue    HPI [name] [name] is a 73 y.o. male.    Cough Here for cough and congestion and fatigue.  Has had some mild dyspnea on exertion.  He has also had some sore throat.  No fever.  His symptoms began last night.  His wife was admitted to the hospital on February 17 with COVID symptoms that were severe.  She has started getting ill that day.  This patient is allergic to Crestor, pravastatin, sertraline, and simvastatin.  He has atrial fibrillation and takes Xarelto.    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   Paroxysmal atrial fibrillation (HCC) 09/07/2016   Family history of coronary artery disease in brother 07/03/2016   Nonischemic cardiomyopathy (HCC) 06/08/2016   Hyperglycemia 06/08/2016   Atrial fibrillation (HCC) 06/08/2016   Hypercholesterolemia 06/08/2016   Moderate obesity 06/08/2016    Past Surgical History:  Procedure Laterality Date   ATRIAL FIBRILLATION ABLATION N/A 09/07/2016   Procedure: Atrial Fibrillation Ablation;  Surgeon: Regan Lemming, MD;  Location: [facility] INVASIVE CV LAB;  Service: Cardiovascular;  Laterality: N/A;   CARDIOVERSION N/A 07/23/2016   Procedure: CARDIOVERSION;  Surgeon: Thurmon Fair, MD;  Location: [facility] ENDOSCOPY;  Service: Cardiovascular;  Laterality: N/A;   CARDIOVERSION N/A  10/03/2016   Procedure: CARDIOVERSION;  Surgeon: Wendall Stade, MD;  Location: [facility] ENDOSCOPY;  Service: Cardiovascular;  Laterality: N/A;   HERNIA REPAIR  at birth   INGUINAL HERNIA REPAIR Bilateral 10/22/2019   Procedure: LAPAROSCOPIC BILATERAL INGUINAL HERNIA REPAIR WITH MESH;  Surgeon: Kinsinger, De Blanch, MD;  Location: [facility] [place];  Service: General;  Laterality: Bilateral;   LUMBAR LAMINECTOMY/DECOMPRESSION MICRODISCECTOMY Right 10/25/2020   Procedure: MICRODISCECTOMY LUMBAR FIVE-SACRAL ONE;  Surgeon: Lisbeth Renshaw, MD;  Location: [facility] OR;  Service: Neurosurgery;  Laterality: Right;   UMBILICAL HERNIA REPAIR N/A 10/22/2019   Procedure: HERNIA REPAIR UMBILICAL ADULT;  Surgeon: Kinsinger, De Blanch, MD;  Location: [facility];  Service: General;  Laterality: N/A;       Home Medications    Prior to Admission medications   Medication Sig Start Date End Date Taking? Authorizing Provider  atorvastatin (LIPITOR) 10 MG tablet Take 10 mg by mouth daily. 12/08/18  Yes [provider]  benzonatate (TESSALON) 100 MG capsule Take 1 capsule (100 mg total) by mouth 3 (three) times daily as needed for cough. 03/07/23  Yes Zenia Resides, MD  buPROPion (WELLBUTRIN XL) 150 MG 24 hr tablet Take 150 mg by mouth daily. 12/08/18  Yes [provider]  ezetimibe (ZETIA) 10 MG tablet TAKE 1 TABLET DAILY 08/06/22  Yes Croitoru, Mihai, MD  LORazepam (ATIVAN) 1 MG tablet Take 1-2 mg by mouth at bedtime as needed. 11/08/22  Yes [provider]  molnupiravir EUA (LAGEVRIO) 200 mg CAPS capsule Take 4 capsules (800 mg total) by mouth 2 (two) times daily for 5 days. 03/07/23 03/12/23 Yes Zenia Resides, MD  rivaroxaban (XARELTO) 20 MG TABS tablet Take 1 tablet (20 mg total) by mouth daily with supper. 12/20/21  Yes Croitoru, Mihai, MD  albuterol (VENTOLIN HFA) 108 (90 Base) MCG/ACT inhaler Inhale 1-2 puffs into the lungs every 6 (six) hours as needed for  wheezing or shortness of breath. 12/29/21   Ellsworth Lennox, PA-C  aspirin 81 MG chewable tablet Chew 81 mg by mouth daily. 12/08/18   [provider]  cholecalciferol (VITAMIN D) 25 MCG (1000 UNIT) tablet Take 1,000 mcg by mouth daily.    [provider]  HYDROcodone bit-homatropine (HYCODAN) 5-1.5 MG/5ML syrup Take 5 mLs by mouth every 6 (six) hours as needed for cough. 01/02/22   Tomi Bamberger, PA-C  levocetirizine (XYZAL) 5 MG tablet Take 5 mg by mouth every evening. 11/08/22   [provider]  lovastatin (MEVACOR) 40 MG tablet Take 40 mg by mouth at bedtime. Patient not taking: Reported on 03/07/2023 03/11/18   [provider]  Magnesium 250 MG TABS Take 250 mg by mouth daily.    [provider]  omeprazole (PRILOSEC) 20 MG capsule Take 20 mg by mouth daily. Patient not taking: Reported on 03/07/2023    [provider]  omeprazole (PRILOSEC) 20 MG capsule Take 20 mg by mouth daily. 05/10/15   [provider]    Family History Family History  Problem Relation Age of Onset   Heart Problems Father    Alzheimer's disease Sister    Stroke Brother    Heart Problems Brother    Heart Problems Brother     Social History Social History   Tobacco Use   Smoking status: Never    Passive exposure: Never   Smokeless tobacco: Never  Vaping Use   Vaping status: Never Used  Substance Use Topics   Alcohol use: Not Currently    Comment: 1-2 beers per day   Drug use: No     Allergies   Crestor [rosuvastatin], Pravastatin sodium, Sertraline hcl, and Simvastatin   Review of Systems Review of Systems  Respiratory:  Positive for cough.      Physical Exam Triage Vital Signs ED Triage Vitals  Encounter Vitals Group     BP 03/07/23 1511 (!) 144/75     Systolic BP Percentile --      Diastolic BP Percentile --      Pulse Rate 03/07/23 1511 82     Resp 03/07/23 1511 20     Temp 03/07/23 1511 98.6 F (37 C)     Temp Source  03/07/23 1511 Oral     SpO2 03/07/23 1511 95 %     Weight --      Height --      Head Circumference --      Peak Flow --      Pain Score 03/07/23 1512 8     Pain Loc --      Pain Education --      Exclude from Growth Chart --    No data found.  Updated Vital Signs BP (!) 144/75 (BP Location: Left Arm)   Pulse 82   Temp 98.6 F (37 C) (Oral)   Resp 20   SpO2 95%   Visual Acuity Right Eye Distance:   Left Eye Distance:   Bilateral Distance:    Right  Eye Near:   Left Eye Near:    Bilateral Near:     Physical Exam Vitals reviewed.  Constitutional:      General: He is not in acute distress.    Appearance: He is not toxic-appearing.  HENT:     Right Ear: Tympanic membrane and ear canal normal.     Left Ear: Tympanic membrane and ear canal normal.     Nose: Congestion present.     Mouth/Throat:     Mouth: Mucous membranes are moist.     Comments: There is some mild erythema in the posterior oropharynx and some clear exudate draining. Eyes:     Extraocular Movements: Extraocular movements intact.     Conjunctiva/sclera: Conjunctivae normal.     Pupils: Pupils are equal, round, and reactive to light.  Cardiovascular:     Rate and Rhythm: Normal rate and regular rhythm.     Heart sounds: No murmur heard. Pulmonary:     Effort: Pulmonary effort is normal. No respiratory distress.     Breath sounds: Normal breath sounds. No stridor. No wheezing, rhonchi or rales.     Comments: He is moving air well Musculoskeletal:     Cervical back: Neck supple.  Lymphadenopathy:     Cervical: No cervical adenopathy.  Skin:    Capillary Refill: Capillary refill takes less than 2 seconds.     Coloration: Skin is not jaundiced or pale.  Neurological:     General: No focal deficit present.     Mental Status: He is alert and oriented to person, place, and time.  Psychiatric:        Behavior: Behavior normal.      UC Treatments / Results  Labs (all labs ordered are listed, but  only abnormal results are displayed) Labs Reviewed  POC COVID19/FLU A&B COMBO - Abnormal; Notable for the following components:      Result Value   Covid Antigen, POC Positive (*)    All other components within normal limits    EKG   Radiology No results found.  Procedures Procedures (including critical care time)  Medications Ordered in UC Medications - No data to display  Initial Impression / Assessment and Plan / UC Course  I have reviewed the triage vital signs and the nursing notes.  Pertinent labs & imaging results that were available during my care of the patient were reviewed by me and considered in my medical decision making (see chart for details).     Flu is negative and COVID test is positive.  He cannot take Paxlovid due to being on Xarelto for his atrial fibrillation due to interactions.  Molnupiravir is sent in to try to prevent severe COVID.  Tessalon Perles are sent in for cough.  Final Clinical Impressions(s) / UC Diagnoses   Final diagnoses:  COVID-19     Discharge Instructions      Your test is positive for COVID; it is negative for flu.  Take molnupiravir--4 capsules by mouth 2 times daily for 5 days  Take benzonatate 100 mg, 1 tab every 8 hours as needed for cough.  Your vital signs are currently good and reassuring.  If you start feeling weaker or more short of breath, please consider going to the emergency room for further evaluation.       ED Prescriptions     Medication Sig Dispense Auth. Provider   molnupiravir EUA (LAGEVRIO) 200 mg CAPS capsule Take 4 capsules (800 mg total) by mouth 2 (two) times daily  for 5 days. 40 capsule Jeanee Fabre, Janace Aris, MD   benzonatate (TESSALON) 100 MG capsule Take 1 capsule (100 mg total) by mouth 3 (three) times daily as needed for cough. 21 capsule Zenia Resides, MD      PDMP not reviewed this encounter.   Zenia Resides, MD 03/07/23 [phone]

## 2023-03-07 NOTE — ED Triage Notes (Signed)
 Pt reports nasal congestion, productive cough, fatigue, and sore throat x3 days. Denies fevers. Pt reports his wife is currently in the hospital with covid. Taking cough syrup at home and another unknown OTC cold med. Requests flu/covid testing.

## 2023-03-25 DIAGNOSIS — I4891 Unspecified atrial fibrillation: Secondary | ICD-10-CM | POA: Diagnosis not present

## 2023-04-09 DIAGNOSIS — G629 Polyneuropathy, unspecified: Secondary | ICD-10-CM | POA: Diagnosis not present

## 2023-04-09 DIAGNOSIS — L570 Actinic keratosis: Secondary | ICD-10-CM | POA: Diagnosis not present

## 2023-04-15 DIAGNOSIS — I4891 Unspecified atrial fibrillation: Secondary | ICD-10-CM | POA: Diagnosis not present

## 2023-04-15 DIAGNOSIS — E7800 Pure hypercholesterolemia, unspecified: Secondary | ICD-10-CM | POA: Diagnosis not present

## 2023-04-23 ENCOUNTER — Other Ambulatory Visit (HOSPITAL_COMMUNITY): Payer: Self-pay

## 2023-04-23 ENCOUNTER — Telehealth: Payer: Self-pay | Admitting: Cardiovascular Disease

## 2023-04-23 ENCOUNTER — Other Ambulatory Visit: Payer: Self-pay | Admitting: Cardiovascular Disease

## 2023-04-23 DIAGNOSIS — I4819 Other persistent atrial fibrillation: Secondary | ICD-10-CM

## 2023-04-23 MED ORDER — RIVAROXABAN 20 MG PO TABS
20.0000 mg | ORAL_TABLET | Freq: Every day | ORAL | 0 refills | Status: DC
Start: 2023-04-23 — End: 2023-06-03
  Filled 2023-04-23: qty 90, 90d supply, fill #0
  Filled 2023-04-24: qty 30, 30d supply, fill #0
  Filled 2023-04-24 (×2): qty 90, 90d supply, fill #0

## 2023-04-23 NOTE — Telephone Encounter (Signed)
 Prescription refill request for Xarelto received.  Indication: PAF Last office visit: 12/05/21  Sherlean Foot NP  (Has appt 06/03/23) Weight: 106.6kg Age: 73 Scr: 0.94 on 10/25/20  Epic CrCl: 110.25  Based on above findings Xarelto 20mg  daily is the appropriate dose.  Pt is past due for MD appt and labs.  Has appt scheduled for 06/02/13.  Will give 3 month supply only.

## 2023-04-23 NOTE — Telephone Encounter (Signed)
*  STAT* If patient is at the pharmacy, call can be transferred to refill team.   1. Which medications need to be refilled? (please list name of each medication and dose if known)  [facility] - [place], [place] - [address]    2. Which pharmacy/location (including street and city if local pharmacy) is medication to be sent to?  [facility] - [place], [place] - [address]    3. Do they need a 30 day or 90 day supply? 90   Patient has appt on 5/19

## 2023-04-23 NOTE — Telephone Encounter (Signed)
*  STAT* If patient is at the pharmacy, call can be transferred to refill team.   1. Which medications need to be refilled? (please list name of each medication and dose if known) need a new prescription for his Xarelto   2. Would you like to learn more about the convenience, safety, & potential cost savings by using the [facility]?     3. Are you open to using the [facility] (Type [facility].   4. Which pharmacy/location (including street and city if local pharmacy) is medication to be sent to?[facility]   5. Do they need a 30 day or 90 day supply? 90 days and refills has an appointment on 06-03-23

## 2023-04-23 NOTE — Telephone Encounter (Addendum)
 Xarelto 20mg  refill request received. Pt is 73 years old, weight-106.6kg, Crea-0.94 on 10/25/20, last seen by Dr. Royann Shivers on 12/05/21 and pending appt on 06/03/23 with Verdon Cummins via Telemedicine, Diagnosis-Afib, CrCl-110.25 mL/min; Dose is appropriate based on dosing criteria.    Last Sent 12/20/21 90 day with 3 refills.  Pt needs labs. Spoke with Angelica Chessman PCP and she took a message to give to the nurse regarding recent labs being being faxed over. Will await before sending refill   @[id number]  noted refill has already been sent by Susa Raring. Pt needs labs since they are overdue. A note to the pharmacy was placed on the refill and after speaking with pharmacy they state that does not go on the prescription and they do read it.   Spoke with Lorin Picket and he will place hold on xarelto with [employer] and a verbal order was given. Also, the refill was sent to the wrong pharmacy as pt requested [facility] and not [facility].   Placed the [facility] pharmacy refill on hold and will need to call once labs are received from PCP.

## 2023-04-24 ENCOUNTER — Other Ambulatory Visit (HOSPITAL_COMMUNITY): Payer: Self-pay

## 2023-04-24 ENCOUNTER — Encounter: Payer: Self-pay | Admitting: Family Medicine

## 2023-04-24 NOTE — Telephone Encounter (Addendum)
 Called PCP again and another message was left. The last labs are from 10/2022 and they will have them faxed over soon. Will await them.  Spoke with [facility] and she stated the cost for the 90 day supply would be $30.   Called pt to update him that the 90 day supply was sent to the [facility] and there was no answer.   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 Apologized for this happening and inquired about the cost they are paying via mail order and advised on the cost I was told it would cost the same and she stated she will like it shipped. Advised I would call about the 90 day supply at the [facility] pharmacy get it transferred to [facility] where they do ship to home. Also, advised that I am awaiting labs to be faxed over from the PCP office that he had sone 10/2022. She was thankful for this assistance. Spoke with Shanda Bumps with the [facility] and she stated she would transfer the prescription to [facility] and they would be able to answer questions regarding payment information.    Removed 90 day supply for [facility] at this time.   Labs received Cre-0.95 on 11/08/22, CrCl-109.09 mL/min

## 2023-04-25 NOTE — Telephone Encounter (Signed)
 Labs received from PCP. Placed in chart under media tab for future reference.

## 2023-05-06 ENCOUNTER — Other Ambulatory Visit (HOSPITAL_COMMUNITY): Payer: Self-pay

## 2023-05-10 DIAGNOSIS — Z1331 Encounter for screening for depression: Secondary | ICD-10-CM | POA: Diagnosis not present

## 2023-05-10 DIAGNOSIS — Z0000 Encounter for general adult medical examination without abnormal findings: Secondary | ICD-10-CM | POA: Diagnosis not present

## 2023-05-10 DIAGNOSIS — Z125 Encounter for screening for malignant neoplasm of prostate: Secondary | ICD-10-CM | POA: Diagnosis not present

## 2023-05-10 DIAGNOSIS — Z23 Encounter for immunization: Secondary | ICD-10-CM | POA: Diagnosis not present

## 2023-05-10 DIAGNOSIS — I4891 Unspecified atrial fibrillation: Secondary | ICD-10-CM | POA: Diagnosis not present

## 2023-05-10 DIAGNOSIS — R972 Elevated prostate specific antigen [PSA]: Secondary | ICD-10-CM | POA: Diagnosis not present

## 2023-05-10 DIAGNOSIS — G4700 Insomnia, unspecified: Secondary | ICD-10-CM | POA: Diagnosis not present

## 2023-05-10 DIAGNOSIS — E7800 Pure hypercholesterolemia, unspecified: Secondary | ICD-10-CM | POA: Diagnosis not present

## 2023-05-10 DIAGNOSIS — K219 Gastro-esophageal reflux disease without esophagitis: Secondary | ICD-10-CM | POA: Diagnosis not present

## 2023-05-10 DIAGNOSIS — R7309 Other abnormal glucose: Secondary | ICD-10-CM | POA: Diagnosis not present

## 2023-05-10 DIAGNOSIS — K429 Umbilical hernia without obstruction or gangrene: Secondary | ICD-10-CM | POA: Diagnosis not present

## 2023-05-15 DIAGNOSIS — E7800 Pure hypercholesterolemia, unspecified: Secondary | ICD-10-CM | POA: Diagnosis not present

## 2023-05-15 DIAGNOSIS — I4891 Unspecified atrial fibrillation: Secondary | ICD-10-CM | POA: Diagnosis not present

## 2023-05-19 DIAGNOSIS — I4891 Unspecified atrial fibrillation: Secondary | ICD-10-CM | POA: Diagnosis not present

## 2023-05-29 DIAGNOSIS — K429 Umbilical hernia without obstruction or gangrene: Secondary | ICD-10-CM | POA: Diagnosis not present

## 2023-05-29 DIAGNOSIS — M6208 Separation of muscle (nontraumatic), other site: Secondary | ICD-10-CM | POA: Diagnosis not present

## 2023-05-31 NOTE — Progress Notes (Signed)
 Cardiology Clinic Note   Patient Name: [name] [name] Date of Encounter: 06/03/2023  Primary Care Provider:  Glena Landau, MD Primary Cardiologist:  Luana Rumple, MD  Patient Profile    [name] [name] 73 year old male presents to the clinic today for follow-up evaluation of his cardiomyopathy and atrial fibrillation.  Past Medical History    Past Medical History:  Diagnosis Date   Arthritis    Atrial fibrillation (HCC)    Cancer (HCC) 2015   melanoma removed from back   GERD (gastroesophageal reflux disease)    History of cardiac dysrhythmia 2018   ablation and cardioversion done   Hyperlipidemia    Nonischemic cardiomyopathy (HCC)    Scab    right elbow healing   Umbilical hernia    Past Surgical History:  Procedure Laterality Date   ATRIAL FIBRILLATION ABLATION N/A 09/07/2016   Procedure: Atrial Fibrillation Ablation;  Surgeon: Lei Pump, MD;  Location: [facility] INVASIVE CV LAB;  Service: Cardiovascular;  Laterality: N/A;   CARDIOVERSION N/A 07/23/2016   Procedure: CARDIOVERSION;  Surgeon: Luana Rumple, MD;  Location: [facility] ENDOSCOPY;  Service: Cardiovascular;  Laterality: N/A;   CARDIOVERSION N/A 10/03/2016   Procedure: CARDIOVERSION;  Surgeon: Loyde Rule, MD;  Location: [facility] ENDOSCOPY;  Service: Cardiovascular;  Laterality: N/A;   HERNIA REPAIR  at birth   INGUINAL HERNIA REPAIR Bilateral 10/22/2019   Procedure: LAPAROSCOPIC BILATERAL INGUINAL HERNIA REPAIR WITH MESH;  Surgeon: Kinsinger, Alphonso Aschoff, MD;  Location: [facility] [place];  Service: General;  Laterality: Bilateral;   LUMBAR LAMINECTOMY/DECOMPRESSION MICRODISCECTOMY Right 10/25/2020   Procedure: MICRODISCECTOMY LUMBAR FIVE-SACRAL ONE;  Surgeon: Augusto Blonder, MD;  Location: [facility] OR;  Service: Neurosurgery;  Laterality: Right;   UMBILICAL HERNIA REPAIR N/A 10/22/2019   Procedure: HERNIA REPAIR UMBILICAL ADULT;  Surgeon: Kinsinger, Alphonso Aschoff, MD;  Location: [facility];   Service: General;  Laterality: N/A;    Allergies  Allergies  Allergen Reactions   Bupropion      Other Reaction(s): ineffective   Colesevelam     Other Reaction(s): constipation   Pravastatin      Other Reaction(s): "felt weird"   Pravastatin  Sodium Other (See Comments)    Other reaction(s): Unknown  Other Reaction(s): Unknown   Rosuvastatin Other (See Comments)    Other reaction(s): Unknown  Other Reaction(s): aches, Unknown   Sertraline Hcl Other (See Comments) and Nausea Only    Other reaction(s): Unknown  Other Reaction(s): Unknown   Simvastatin Other (See Comments)    Other reaction(s): Unknown  Other Reaction(s): aches, Unknown    History of Present Illness    [name] [name] has a PMH of paroxysmal atrial fibrillation, chronic diastolic CHF, HLD, ascending aortic dilation, thrombocytopenia, obesity, and elevated coronary artery calcium  score.  Echocardiogram 3/10 showed LVEF of 60 to 65%, trivial mitral valve regurgitation, aortic dilation measuring 41 mm.  CHA2DS2-VASc score 2-3 age, CHF.  He underwent A-fib ablation 8/18.  He did have asymptomatic recurrent atrial fibrillation.  He was seen in follow-up by Dr. Alvis Ba 03/22/2021.  During that time he remained stable from a cardiac standpoint.  He denied exertional chest pain.  He denied palpitations.  He was noted to have mild lower extremity edema.  He reported some dietary indiscretion.  He denied falls and bleeding issues.  His EKG at that time showed sinus rhythm with LAFB.  He presents to the clinic today for follow-up evaluation states his wife is doing better after her cancer.  She is currently in remission.  He continues to help her out regularly.  She frequently falls.  He does feel that she is getting stronger.  We reviewed his previous cardiac visit and his aortic dilation.  He expressed understanding.  We reviewed his recent cholesterol and calcium  scoring.  He stays fairly physically active.  He does have  some dietary indiscretion.  His EKG today shows sinus rhythm 67 bpm.  I will refill Xarelto , order echocardiogram, and refer to pharmacy lipid clinic for consideration of PCSK9 inhibitor.  Will plan follow-up in 12 months..  Today he denies chest pain, shortness of breath, lower extremity edema, fatigue, palpitations, melena, hematuria, hemoptysis, diaphoresis, weakness, presyncope, syncope, orthopnea, and PND.   Home Medications    Prior to Admission medications   Medication Sig Start Date End Date Taking? Authorizing Provider  albuterol  (VENTOLIN  HFA) 108 (90 Base) MCG/ACT inhaler Inhale 1-2 puffs into the lungs every 6 (six) hours as needed for wheezing or shortness of breath. 12/29/21   Gretel Leaven, PA-C  aspirin 81 MG chewable tablet Chew 81 mg by mouth daily. 12/08/18   [provider]  atorvastatin (LIPITOR) 10 MG tablet Take 10 mg by mouth daily. 12/08/18   [provider]  benzonatate  (TESSALON ) 100 MG capsule Take 1 capsule (100 mg total) by mouth 3 (three) times daily as needed for cough. 03/07/23   Ann Keto, MD  buPROPion  (WELLBUTRIN  XL) 150 MG 24 hr tablet Take 150 mg by mouth daily. 12/08/18   [provider]  cholecalciferol (VITAMIN D) 25 MCG (1000 UNIT) tablet Take 1,000 mcg by mouth daily.    [provider]  ezetimibe  (ZETIA ) 10 MG tablet TAKE 1 TABLET DAILY 08/06/22   Croitoru, Karyl Paget, MD  HYDROcodone  bit-homatropine (HYCODAN) 5-1.5 MG/5ML syrup Take 5 mLs by mouth every 6 (six) hours as needed for cough. 01/02/22   Vernestine Gondola, PA-C  levocetirizine (XYZAL) 5 MG tablet Take 5 mg by mouth every evening. 11/08/22   [provider]  LORazepam (ATIVAN) 1 MG tablet Take 1-2 mg by mouth at bedtime as needed. 11/08/22   [provider]  lovastatin (MEVACOR) 40 MG tablet Take 40 mg by mouth at bedtime. Patient not taking: Reported on 03/07/2023 03/11/18   [provider]  Magnesium 250 MG TABS Take 250 mg by  mouth daily.    [provider]  omeprazole (PRILOSEC) 20 MG capsule Take 20 mg by mouth daily. Patient not taking: Reported on 03/07/2023    [provider]  omeprazole (PRILOSEC) 20 MG capsule Take 20 mg by mouth daily. 05/10/15   [provider]  rivaroxaban  (XARELTO ) 20 MG TABS tablet Take 1 tablet (20 mg total) by mouth daily with supper. 04/23/23   Croitoru, Karyl Paget, MD    Family History    Family History  Problem Relation Age of Onset   Heart Problems Father    Alzheimer's disease Sister    Stroke Brother    Heart Problems Brother    Heart Problems Brother    He indicated that his mother is deceased. He indicated that his father is deceased. He indicated that two of his three sisters are alive. He indicated that only one of his three brothers is alive. He indicated that his child is alive.  Social History    Social History   Socioeconomic History   Marital status: Married    Spouse name: Not on file   Number of children: Not on file   Years of education: Not on file  Highest education level: Not on file  Occupational History   Not on file  Tobacco Use   Smoking status: Never    Passive exposure: Never   Smokeless tobacco: Never  Vaping Use   Vaping status: Never Used  Substance and Sexual Activity   Alcohol use: Not Currently    Comment: 1-2 beers per day   Drug use: No   Sexual activity: Not Currently  Other Topics Concern   Not on file  Social History Narrative   Not on file   Social Drivers of Health   Financial Resource Strain: Not on file  Food Insecurity: Not on file  Transportation Needs: Not on file  Physical Activity: Not on file  Stress: Not on file  Social Connections: Not on file  Intimate Partner Violence: Not on file     Review of Systems    General:  No chills, fever, night sweats or weight changes.  Cardiovascular:  No chest pain, dyspnea on exertion, edema, orthopnea, palpitations, paroxysmal nocturnal  dyspnea. Dermatological: No rash, lesions/masses Respiratory: No cough, dyspnea Urologic: No hematuria, dysuria Abdominal:   No nausea, vomiting, diarrhea, bright red blood per rectum, melena, or hematemesis Neurologic:  No visual changes, wkns, changes in mental status. All other systems reviewed and are otherwise negative except as noted above.  Physical Exam    VS:  BP 112/62 (BP Location: Left Arm, Patient Position: Sitting, Cuff Size: Large)   Pulse 68   Ht 5\' 8"  (1.727 m)   Wt 244 lb (110.7 kg)   SpO2 94%   BMI 37.10 kg/m  , BMI Body mass index is 37.1 kg/m. GEN: Well nourished, well developed, in no acute distress. HEENT: normal. Neck: Supple, no JVD, carotid bruits, or masses. Cardiac: RRR, no murmurs, rubs, or gallops. No clubbing, cyanosis, mild ankle edema.  Radials/DP/PT 2+ and equal bilaterally.  Respiratory:  Respirations regular and unlabored, clear to auscultation bilaterally. GI: Soft, nontender, nondistended, BS + x 4. MS: no deformity or atrophy. Skin: warm and dry, no rash. Neuro:  Strength and sensation are intact. Psych: Normal affect.  Accessory Clinical Findings    Recent Labs: No results found for requested labs within last 365 days.   Recent Lipid Panel    Component Value Date/Time   CHOL 182 09/13/2017 0740   TRIG 109 09/13/2017 0740   HDL 45 09/13/2017 0740   CHOLHDL 4.0 09/13/2017 0740   LDLCALC 115 (H) 09/13/2017 0740         ECG personally reviewed by me today- EKG Interpretation Date/Time:  Monday Jun 03 2023 13:43:14 EDT Ventricular Rate:  67 PR Interval:  170 QRS Duration:  88 QT Interval:  410 QTC Calculation: 433 R Axis:   -55  Text Interpretation: Normal sinus rhythm Left axis deviation Confirmed by Lawana Pray [phone]) on 06/03/2023 1:44:08 PM    Cardiac CT 8/18  FINDINGS: Non-cardiac: See separate report from [facility]. No significant findings on limited lung and soft tissue windows.   Calcium  Score:  370 with calcium  seen in all 3 major epicardial coronary arteries   Mild biatrial enlargement. No ASD/VSD. No pericardial effusion No LAA thrombus. Esophagus courses closest to the LLPV ostium   LUPV:  Ostium 18.4 mm Area 2.25 cm2   LLPV:  Ostium 15.8 mm Area 1.6 cm2   RUPV:  Ostium 16.8 mm Area 2.2 cm2   RMPV:  Ostium 13.4 mm Area 1.2 cm2   RLPV:  Ostium 18 mm Area 2.7 cm2   No  pulmonary vein anomalies   IMPRESSION: 1) Normal pulmonary vein anatomy with RMPV branching immediately from RUPV   2) No LAA thrombus   3) Mild biatrial enlargement   4) Calcium  Score 370 80 th percentile for age and sex Patient had a normal ETT 06/26/16 consider f/u perfusion imaging in a year   Janelle Mediate     Electronically Signed   By: Janelle Mediate M.D.   On: 08/31/2016 16:06     Assessment & Plan   1.  Diastolic CHF-euvolemic today.  Mild bilateral lower extremity nonpitting generalized edema.  Previously felt that his cardiomyopathy may be tachycardia mediated.  Well compensated. Heart healthy low-sodium diet Increase physical activity as tolerated Lower extremity support stockings Continue losartan  Atrial fibrillation-EKG today shows sinus rhythm 67 bpm.  CHA2DS2-VASc score 2-3 (age,CHF).  He is status post ablation.  Reports compliance with Xarelto .  Denies bleeding issues. Continue Xarelto -refill  Aortic dilation-previously noted to be 41 mm.  Blood pressure well-controlled. Maintain good blood pressure control Repeat complete echo  Hyperlipidemia-LDL 92 on 05/10/23. High-fiber diet Increase physical activity as tolerated Continue atorvastatin, ezetimibe  Refer to pharm lipid clinic   Coronary artery disease-noted to have coronary calcium  score of 370 on 8/18.  He is statin intolerant. Continue aspirin,  ezetimibe , statin Heart healthy low-sodium diet  Obesity-weight today 244 lbs. Reduced calorie diet Continue weight loss  Disposition: Follow-up with Dr. Alvis Ba  or me in 12 months.   Chet Cota. Eris Hannan NP-C     06/03/2023, 1:44 PM [facility] [address] Office [phone] Fax [phone]    I spent 14 minutes examining this patient, reviewing medications, and using patient centered shared decision making involving their cardiac care.   I spent  20 minutes reviewing past medical history,  medications, and prior cardiac tests.

## 2023-06-03 ENCOUNTER — Encounter: Payer: Self-pay | Admitting: General Practice

## 2023-06-03 ENCOUNTER — Ambulatory Visit: Attending: General Practice | Admitting: General Practice

## 2023-06-03 VITALS — BP 112/62 | HR 68 | Ht 68.0 in | Wt 244.0 lb

## 2023-06-03 DIAGNOSIS — E669 Obesity, unspecified: Secondary | ICD-10-CM | POA: Diagnosis not present

## 2023-06-03 DIAGNOSIS — I77810 Thoracic aortic ectasia: Secondary | ICD-10-CM | POA: Diagnosis not present

## 2023-06-03 DIAGNOSIS — I2510 Atherosclerotic heart disease of native coronary artery without angina pectoris: Secondary | ICD-10-CM | POA: Diagnosis not present

## 2023-06-03 DIAGNOSIS — I5030 Unspecified diastolic (congestive) heart failure: Secondary | ICD-10-CM | POA: Diagnosis not present

## 2023-06-03 DIAGNOSIS — I4819 Other persistent atrial fibrillation: Secondary | ICD-10-CM | POA: Insufficient documentation

## 2023-06-03 MED ORDER — RIVAROXABAN 20 MG PO TABS
20.0000 mg | ORAL_TABLET | Freq: Every day | ORAL | 0 refills | Status: DC
Start: 2023-06-03 — End: 2023-06-03

## 2023-06-03 MED ORDER — RIVAROXABAN 20 MG PO TABS: 20.0000 mg | ORAL_TABLET | Freq: Every day | ORAL | 3 refills | Status: AC

## 2023-06-03 NOTE — Addendum Note (Signed)
 Addended by: Carie Charity on: 06/03/2023 02:13 PM   Modules accepted: Orders

## 2023-06-03 NOTE — Patient Instructions (Signed)
 Medication Instructions:  The current medical regimen is effective;  continue present plan and medications as directed. Please refer to the Current Medication list given to you today.  *If you need a refill on your cardiac medications before your next appointment, please call your pharmacy*  Lab Work: NONE  Testing/Procedures: Your physician has requested that you have an echocardiogram. Echocardiography is a painless test that uses sound waves to create images of your heart. It provides your doctor with information about the size and shape of your heart and how well your heart's chambers and valves are working. This procedure takes approximately one hour. There are no restrictions for this procedure. Please do NOT wear cologne, perfume, aftershave, or lotions (deodorant is allowed). Please arrive 15 minutes prior to your appointment time.  Please note: We ask at that you not bring children with you during ultrasound (echo/ vascular) testing. Due to room size and safety concerns, children are not allowed in the ultrasound rooms during exams. Our front office staff cannot provide observation of children in our lobby area while testing is being conducted. An adult accompanying a patient to their appointment will only be allowed in the ultrasound room at the discretion of the ultrasound technician under special circumstances. We apologize for any inconvenience.   REFERRAL TO PHARMACY LIPID CLINIC  Follow-Up: At [facility], you and your health needs are our priority.  As part of our continuing mission to provide you with exceptional heart care, our providers are all part of one team.  This team includes your primary Cardiologist (physician) and Advanced Practice Providers or APPs (Physician Assistants and Nurse Practitioners) who all work together to provide you with the care you need, when you need it.  Your next appointment:   12 month(s)  Provider:   Luana Rumple, MD    We  recommend signing up for the patient portal called "MyChart".  Sign up information is provided on this After Visit Summary.  MyChart is used to connect with patients for Virtual Visits (Telemedicine).  Patients are able to view lab/test results, encounter notes, upcoming appointments, etc.  Non-urgent messages can be sent to your provider as well.   To learn more about what you can do with MyChart, go to [email or web address].   Other Instructions PLEASE READ AND FOLLOW INCREASED FIBER DIET-ATTACHED   High-Fiber Eating Plan Fiber, also called dietary fiber, is found in foods such as fruits, vegetables, whole grains, and beans. A high-fiber diet can be good for your health. Your health care provider may recommend a high-fiber diet to help: Prevent trouble pooping (constipation). Lower your cholesterol. Treat the following conditions: Hemorrhoids. This is inflammation of veins in the anus. Inflammation of specific areas of the digestive tract. Irritable bowel syndrome (IBS). This is a problem of the large intestine, also called the colon, that sometimes causes belly pain and bloating. Prevent overeating as part of a weight-loss plan. Lower the risk of heart disease, type 2 diabetes, and certain cancers. What are tips for following this plan? Reading food labels  Check the nutrition facts label on foods for the amount of dietary fiber. Choose foods that have 4 grams of fiber or more per serving. The recommended goals for how much fiber you should eat each day include: Males 36 years old or younger: 30-34 g. Males over 31 years old: 28-34 g. Females 8 years old or younger: 25-28 g. Females over 38 years old: 22-25 g. Your daily fiber goal is _____________ g. Shopping Choose whole  fruits and vegetables instead of processed. For example, choose apples instead of apple juice or applesauce. Choose a variety of high-fiber foods such as avocados, lentils, oats, and pinto beans. Read the  nutrition facts label on foods. Check for foods with added fiber. These foods often have high sugar and salt (sodium) amounts per serving. Cooking Use whole-grain flour for baking and cooking. Cook with brown rice instead of white rice. Make meals that have a lot of beans and vegetables in them, such as chili or vegetable-based soups. Meal planning Start the day with a breakfast that is high in fiber, such as a cereal that has 5 g of fiber or more per serving. Eat breads and cereals that are made with whole-grain flour instead of refined flour or white flour. Eat brown rice, bulgur wheat, or millet instead of white rice. Use beans in place of meat in soups, salads, and pasta dishes. Be sure that half of the grains you eat each day are whole grains. General information You can get the recommended amount of dietary fiber by: Eating a variety of fruits, vegetables, grains, nuts, and beans. Taking a fiber supplement if you aren't able to eat enough fiber. It's better to get fiber through food than from a supplement. Slowly increase how much fiber you eat. If you increase the amount of fiber you eat too quickly, you may have bloating, cramping, or gas. Drink plenty of water to help you digest fiber. Choose high-fiber snacks, such as berries, raw vegetables, nuts, and popcorn. What foods should I eat? Fruits Berries. Pears. Apples. Oranges. Avocado. Prunes and raisins. Dried figs. Vegetables Sweet potatoes. Spinach. Kale. Artichokes. Cabbage. Broccoli. Cauliflower. Green peas. Carrots. Squash. Grains Whole-grain breads. Multigrain cereal. Oats and oatmeal. Brown rice. Barley. Bulgur wheat. Millet. Quinoa. Bran muffins. Popcorn. Rye wafer crackers. Meats and other proteins Navy beans, kidney beans, and pinto beans. Soybeans. Split peas. Lentils. Nuts and seeds. Dairy Fiber-fortified yogurt. Fortified means that fiber has been added to the product. Beverages Fiber-fortified soy milk.  Fiber-fortified orange juice. Other foods Fiber bars. The items listed above may not be all the foods and drinks you can have. Talk to a dietitian to learn more. What foods should I avoid? Fruits Fruit juice. Cooked, strained fruit. Vegetables Fried potatoes. Canned vegetables. Well-cooked vegetables. Grains White bread. Pasta made with refined flour. White rice. Meats and other proteins Fatty meat. Fried chicken or fried fish. Dairy Milk. Cream cheese. Sour cream. Fats and oils Butters. Beverages Soft drinks. Other foods Cakes and pastries. The items listed above may not be all the foods and drinks you should avoid. Talk to a dietitian to learn more. This information is not intended to replace advice given to you by your health care provider. Make sure you discuss any questions you have with your health care provider. Document Revised: 03/26/2022 Document Reviewed: 03/26/2022 Elsevier Patient Education  2024 [employer].

## 2023-06-15 DIAGNOSIS — I4891 Unspecified atrial fibrillation: Secondary | ICD-10-CM | POA: Diagnosis not present

## 2023-06-15 DIAGNOSIS — E7800 Pure hypercholesterolemia, unspecified: Secondary | ICD-10-CM | POA: Diagnosis not present

## 2023-06-18 DIAGNOSIS — I4891 Unspecified atrial fibrillation: Secondary | ICD-10-CM | POA: Diagnosis not present

## 2023-07-11 ENCOUNTER — Ambulatory Visit: Payer: Self-pay | Admitting: General Practice

## 2023-07-11 ENCOUNTER — Ambulatory Visit (HOSPITAL_COMMUNITY)
Admission: RE | Admit: 2023-07-11 | Discharge: 2023-07-11 | Disposition: A | Discharge status: Home / Self Care | Source: Ambulatory Visit | Attending: Cardiology | Admitting: Cardiology

## 2023-07-11 DIAGNOSIS — I77810 Thoracic aortic ectasia: Secondary | ICD-10-CM

## 2023-07-11 DIAGNOSIS — I5030 Unspecified diastolic (congestive) heart failure: Secondary | ICD-10-CM

## 2023-07-11 LAB — ECHOCARDIOGRAM COMPLETE
Area-P 1/2: 3.85 cm2
S' Lateral: 3.5 cm

## 2023-07-15 DIAGNOSIS — E7800 Pure hypercholesterolemia, unspecified: Secondary | ICD-10-CM | POA: Diagnosis not present

## 2023-07-15 DIAGNOSIS — I4891 Unspecified atrial fibrillation: Secondary | ICD-10-CM | POA: Diagnosis not present

## 2023-07-18 DIAGNOSIS — I4891 Unspecified atrial fibrillation: Secondary | ICD-10-CM | POA: Diagnosis not present

## 2023-07-25 ENCOUNTER — Encounter: Payer: Self-pay | Admitting: Pharmacist Clinician (PhC)/ Clinical Pharmacy Specialist

## 2023-07-25 ENCOUNTER — Other Ambulatory Visit (HOSPITAL_COMMUNITY): Payer: Self-pay

## 2023-07-25 ENCOUNTER — Ambulatory Visit: Attending: Cardiology | Admitting: Pharmacist Clinician (PhC)/ Clinical Pharmacy Specialist

## 2023-07-25 DIAGNOSIS — E7800 Pure hypercholesterolemia, unspecified: Secondary | ICD-10-CM | POA: Diagnosis not present

## 2023-07-25 MED ORDER — EZETIMIBE 10 MG PO TABS
10.0000 mg | ORAL_TABLET | Freq: Every day | ORAL | 3 refills | Status: AC
  Filled 2023-07-25: qty 90, 90d supply, fill #0
  Filled 2023-11-27: qty 30, 30d supply, fill #0
  Filled 2023-11-27: qty 90, 90d supply, fill #1

## 2023-07-25 NOTE — Patient Instructions (Signed)
 Your Results:             Your most recent labs Goal  Total Cholesterol 182 < 200  Triglycerides 109 < 150  HDL (happy/good cholesterol) 45 > 40  LDL (lousy/bad cholesterol 115 < 70   Medication changes:  Start ezetimibe  10 mg once daily at bedtime  Move lovastatin 40 mg once daily to bedtime  Lab orders:  We want to repeat labs after 2 months.  We will send you a lab order to remind you once we get closer to that time.    Thank you for choosing [facility]

## 2023-07-25 NOTE — Assessment & Plan Note (Signed)
 Assessment: Patient with ASCVD not at LDL goal of < 70 Most recent LDL 92 on 05/10/23 Has been compliant with moderate intensity statin : lovastatin 40 mg  (taking in AM) Not able to tolerate pravastatin , rosuvastatin or simvastatin 2/2 myalgias Reviewed options for lowering LDL cholesterol, including ezetimibe , PCSK-9 inhibitors, bempedoic acid and inclisiran.  Discussed mechanisms of action, dosing, side effects, potential decreases in LDL cholesterol and costs.  Also reviewed potential options for patient assistance.  Plan: Patient agreeable to starting ezetimibe  10 mg daily Will move lovastatin 40 mg to bedtime to increase lipid lowering benefit Repeat labs after:  2 months Lipid Liver function

## 2023-07-25 NOTE — Progress Notes (Signed)
 Office Visit    Patient Name: [name] [name] Date of Encounter: 07/25/2023  Primary Care Provider:  Loreli Kins, MD Primary Cardiologist:  Jerel Balding, MD  Chief Complaint    Hyperlipidemia   Significant Past Medical History   CAD 2018 CAC = 370  AF  Post ablation, CHADS2-VASc = 3, on Xarelto   CHF Diastolic, well compensated, on losartan  preDM 4/25 A1c 5.7  obesity 5/25 BMI 37.1     Allergies  Allergen Reactions   Bupropion      Other Reaction(s): ineffective   Colesevelam     Other Reaction(s): constipation   Pravastatin      Other Reaction(s): felt weird   Pravastatin  Sodium Other (See Comments)    Other reaction(s): Unknown  Other Reaction(s): Unknown   Rosuvastatin Other (See Comments)    Other reaction(s): Unknown  Other Reaction(s): aches, Unknown   Sertraline Hcl Other (See Comments) and Nausea Only    Other reaction(s): Unknown  Other Reaction(s): Unknown   Simvastatin Other (See Comments)    Other reaction(s): Unknown  Other Reaction(s): aches, Unknown    History of Present Illness    [name] [name] is a 73 y.o. male patient of Dr Balding, in the office today to discuss options for cholesterol management.  Currently takes his lovastatin in the mornings with his other medications.  Insurance Carrier:  [employer]  Pharmacy:   [facility]    LDL Cholesterol goal:  LDL < 70  Current Medications:  lovastatin 40 mg daily  Previously tried:  pravastatin , rosuvastatin, simvastatin - myalgias  Family Hx:   6 siblings, 2 sisters (smokers) with lung issues; mother died when pt was 4, sudden death; dad with heart disease; 1 son, has hydrogenitis (maybe hidradenitis supurativa)  Social Hx: Tobacco: no Alcohol:  occasional beer  Diet:   mostly home cooked , variety of proteins, some stir fry; some vegetabels, fresh (garden)   Exercise: working outside, garden, yard   Accessory Clinical Findings   05/10/23 in [facility]  ([facility]) TC 153, TG 65, HDL 49, LDL 92  (on lovastatin)  Lab Results  Component Value Date   CHOL 182 09/13/2017   HDL 45 09/13/2017   LDLCALC 115 (H) 09/13/2017   TRIG 109 09/13/2017   CHOLHDL 4.0 09/13/2017    No results found for: LIPOA  Lab Results  Component Value Date   ALT 23 09/13/2017   AST 19 09/13/2017   ALKPHOS 59 09/13/2017   BILITOT 1.1 09/13/2017   Lab Results  Component Value Date   CREATININE 0.94 10/25/2020   BUN 10 10/25/2020   NA 134 (L) 10/25/2020   K 4.1 10/25/2020   CL 103 10/25/2020   CO2 23 10/25/2020   No results found for: HGBA1C  Home Medications    Current Outpatient Medications  Medication Sig Dispense Refill   albuterol  (VENTOLIN  HFA) 108 (90 Base) MCG/ACT inhaler Inhale 1-2 puffs into the lungs every 6 (six) hours as needed for wheezing or shortness of breath. 8 g 0   ezetimibe  (ZETIA ) 10 MG tablet Take 1 tablet (10 mg total) by mouth daily. 90 tablet 3   lovastatin (MEVACOR) 40 MG tablet Take 40 mg by mouth at bedtime.     omeprazole (PRILOSEC) 20 MG capsule Take 20 mg by mouth daily.     rivaroxaban  (XARELTO ) 20 MG TABS tablet Take 1 tablet (20 mg total) by mouth daily with supper. 90 tablet 3   No current facility-administered medications for this visit.  Assessment & Plan    Hypercholesterolemia Assessment: Patient with ASCVD not at LDL goal of < 70 Most recent LDL 92 on 05/10/23 Has been compliant with moderate intensity statin : lovastatin 40 mg  (taking in AM) Not able to tolerate pravastatin , rosuvastatin or simvastatin 2/2 myalgias Reviewed options for lowering LDL cholesterol, including ezetimibe , PCSK-9 inhibitors, bempedoic acid and inclisiran.  Discussed mechanisms of action, dosing, side effects, potential decreases in LDL cholesterol and costs.  Also reviewed potential options for patient assistance.  Plan: Patient agreeable to starting ezetimibe  10 mg daily Will move lovastatin 40 mg to bedtime to increase  lipid lowering benefit Repeat labs after:  2 months Lipid Liver function   Allean Mink, PharmD CPP [facility] [address]   [place], [place] [address] [phone]  07/25/2023, 1:32 PM

## 2023-08-15 DIAGNOSIS — I4891 Unspecified atrial fibrillation: Secondary | ICD-10-CM | POA: Diagnosis not present

## 2023-08-15 DIAGNOSIS — E7800 Pure hypercholesterolemia, unspecified: Secondary | ICD-10-CM | POA: Diagnosis not present

## 2023-08-17 DIAGNOSIS — I4891 Unspecified atrial fibrillation: Secondary | ICD-10-CM | POA: Diagnosis not present

## 2023-09-15 DIAGNOSIS — E7800 Pure hypercholesterolemia, unspecified: Secondary | ICD-10-CM | POA: Diagnosis not present

## 2023-09-15 DIAGNOSIS — I4891 Unspecified atrial fibrillation: Secondary | ICD-10-CM | POA: Diagnosis not present

## 2023-09-16 DIAGNOSIS — I4891 Unspecified atrial fibrillation: Secondary | ICD-10-CM | POA: Diagnosis not present

## 2023-09-23 ENCOUNTER — Encounter: Payer: Self-pay | Admitting: Pharmacist Clinician (PhC)/ Clinical Pharmacy Specialist

## 2023-10-15 DIAGNOSIS — I4891 Unspecified atrial fibrillation: Secondary | ICD-10-CM | POA: Diagnosis not present

## 2023-10-15 DIAGNOSIS — E7800 Pure hypercholesterolemia, unspecified: Secondary | ICD-10-CM | POA: Diagnosis not present

## 2023-10-16 DIAGNOSIS — I4891 Unspecified atrial fibrillation: Secondary | ICD-10-CM | POA: Diagnosis not present

## 2023-11-13 DIAGNOSIS — R972 Elevated prostate specific antigen [PSA]: Secondary | ICD-10-CM | POA: Diagnosis not present

## 2023-11-13 DIAGNOSIS — R7309 Other abnormal glucose: Secondary | ICD-10-CM | POA: Diagnosis not present

## 2023-11-13 DIAGNOSIS — Z23 Encounter for immunization: Secondary | ICD-10-CM | POA: Diagnosis not present

## 2023-11-13 DIAGNOSIS — E7800 Pure hypercholesterolemia, unspecified: Secondary | ICD-10-CM | POA: Diagnosis not present

## 2023-11-15 DIAGNOSIS — I4891 Unspecified atrial fibrillation: Secondary | ICD-10-CM | POA: Diagnosis not present

## 2023-11-15 DIAGNOSIS — E7800 Pure hypercholesterolemia, unspecified: Secondary | ICD-10-CM | POA: Diagnosis not present

## 2023-11-27 ENCOUNTER — Other Ambulatory Visit (HOSPITAL_COMMUNITY): Payer: Self-pay
